# Patient Record
Sex: Male | Born: 1935 | Race: White | Hispanic: No | Smoking: Former smoker
Health system: Southern US, Community
[De-identification: ages and names within clinical notes are randomized; demographics above are authoritative.]

## PROBLEM LIST (undated history)

## (undated) DIAGNOSIS — G231 Progressive supranuclear ophthalmoplegia [Steele-Richardson-Olszewski]: Secondary | ICD-10-CM

## (undated) DIAGNOSIS — J449 Chronic obstructive pulmonary disease, unspecified: Secondary | ICD-10-CM

## (undated) DIAGNOSIS — K635 Polyp of colon: Secondary | ICD-10-CM

## (undated) DIAGNOSIS — G709 Myoneural disorder, unspecified: Secondary | ICD-10-CM

## (undated) DIAGNOSIS — M1990 Unspecified osteoarthritis, unspecified site: Secondary | ICD-10-CM

## (undated) DIAGNOSIS — R490 Dysphonia: Secondary | ICD-10-CM

## (undated) DIAGNOSIS — R0602 Shortness of breath: Secondary | ICD-10-CM

## (undated) HISTORY — PX: CATARACT EXTRACTION: SUR2

## (undated) HISTORY — PX: TONSILLECTOMY: SUR1361

## (undated) HISTORY — DX: Unspecified osteoarthritis, unspecified site: M19.90

## (undated) HISTORY — DX: Dysphonia: R49.0

## (undated) HISTORY — DX: Chronic obstructive pulmonary disease, unspecified: J44.9

## (undated) HISTORY — DX: Polyp of colon: K63.5

## (undated) HISTORY — DX: Progressive supranuclear ophthalmoplegia (steele-Richardson-olszewski): G23.1

---

## 2010-03-27 LAB — PULMONARY FUNCTION TEST

## 2011-01-20 ENCOUNTER — Ambulatory Visit (INDEPENDENT_AMBULATORY_CARE_PROVIDER_SITE_OTHER)
Admission: RE | Admit: 2011-01-20 | Discharge: 2011-01-20 | Disposition: A | Discharge status: Home / Self Care | Payer: Medicare Other | Source: Ambulatory Visit | Attending: Internal Medicine | Admitting: Internal Medicine

## 2011-01-20 ENCOUNTER — Ambulatory Visit (INDEPENDENT_AMBULATORY_CARE_PROVIDER_SITE_OTHER): Payer: Medicare Other | Admitting: Internal Medicine

## 2011-01-20 ENCOUNTER — Encounter: Payer: Self-pay | Admitting: *Deleted

## 2011-01-20 VITALS — BP 110/76 | HR 99 | Ht 73.0 in | Wt 179.0 lb

## 2011-01-20 DIAGNOSIS — J449 Chronic obstructive pulmonary disease, unspecified: Secondary | ICD-10-CM | POA: Insufficient documentation

## 2011-01-20 DIAGNOSIS — G231 Progressive supranuclear ophthalmoplegia [Steele-Richardson-Olszewski]: Secondary | ICD-10-CM | POA: Insufficient documentation

## 2011-01-20 DIAGNOSIS — J4489 Other specified chronic obstructive pulmonary disease: Secondary | ICD-10-CM

## 2011-01-20 DIAGNOSIS — G608 Other hereditary and idiopathic neuropathies: Secondary | ICD-10-CM

## 2011-01-20 DIAGNOSIS — Z0000 Encounter for general adult medical examination without abnormal findings: Secondary | ICD-10-CM

## 2011-01-20 HISTORY — DX: Progressive supranuclear ophthalmoplegia (steele-Richardson-olszewski): G23.1

## 2011-01-20 MED ORDER — TIOTROPIUM BROMIDE MONOHYDRATE 18 MCG IN CAPS: 18.0000 ug | ORAL_CAPSULE | Freq: Every day | RESPIRATORY_TRACT | Status: DC

## 2011-01-20 MED ORDER — SALMETEROL XINAFOATE 50 MCG/DOSE IN AEPB: 1.0000 | INHALATION_SPRAY | Freq: Two times a day (BID) | RESPIRATORY_TRACT | Status: DC

## 2011-01-20 MED ORDER — BECLOMETHASONE DIPROPIONATE 80 MCG/ACT IN AERS: 1.0000 | INHALATION_SPRAY | Freq: Two times a day (BID) | RESPIRATORY_TRACT | Status: DC

## 2011-01-20 NOTE — Progress Notes (Signed)
Subjective:    Patient ID: Spencer Montoya, male    DOB: [birth year], 75 y.o.   MRN: [id number]  HPI 01/20/11-  87 yoM former smoker, here with daughter,  seen for pulmonary evaluation at request of daughter who was former patient here.  50 Pack year smoking hx, QS x 5 years. Dx'd COPD stage 2, about 4-5 years ago while living in [place]. He moved to [place] 1 year ago, and moves here now to live with daughter. Widower, retired from [employer]..  Home O2 used for sleep x 2 years, but this was discontinued on move from [place], off x 1 week, needing to get re-established.  Describes little day to day change in breathing- probably worse in past year. . DOE around the house, but doesn't wake him. Does wheeze,  But controlled with Qvar 80, Serevent, Spiriva. Coughs only swallowing liquids. Swallowing eval reported negative- 09/05/10. Marland Kitchen  Has Supranuclear Palsy "form of Parkinson's", with no recent neurology evaluation. Aphonia  from this is almost total x 2 years- faintly whispers. Had Speech therapy [employer] [facility] [place] [facility] - uses walker. Denies heart disease Pneumonia x 1, 3-4 years ago. Had pneumovax. Gets flu vax. PFT 03/27/10- FEV1 2.15/ 68%; FEV1/FVC 0.77, No response to BD; TLC 115%; RV/ TLC 132%;  CXR- 03/05/10- NAD  Review of Systems Constitutional:   No-   weight loss, night sweats, fevers, chills, fatigue, lassitude. HEENT:   No-   headaches, difficulty swallowing, tooth/dental problems, sore throat,                  No-   sneezing, itching, ear ache, nasal congestion, post nasal drip,   CV:  No-   chest pain, orthopnea, PND, swelling in lower extremities, anasarca, dizziness, palpitations  GI:  No-   heartburn, indigestion, abdominal pain, nausea, vomiting, diarrhea,                 change in bowel habits, loss of appetite  Resp:  No-  excess mucus,               No-  coughing up of blood.              No-   change in color of mucus.   Skin: No-   rash or  lesions.  GU: No-   dysuria, change in color of urine, no urgency or frequency.  No- flank pain.  MS:  No-   joint pain or swelling.  No- decreased range of motion.  No- back pain.  Psych:  No- change in mood or affect. No depression or anxiety.  No memory loss.      Objective:   Physical Exam General- Alert, Oriented, Affect-appropriate, Distress- none acute  Medium build   walker Skin- rash-none, lesions- none, excoriation- none Lymphadenopathy- none Head- atraumatic            Eyes- Gross vision intact, PERRLA, conjunctivae clear secretions            Ears- Hearing, canals            Nose- Clear, Septal dev, mucus, polyps, erosion, perforation             Throat- Mallampati II , mucosa clear , drainage- none, tonsils- atrophic   Almost aphonic- faint whisper few words Neck- flexible , trachea midline, no stridor , thyroid nl, carotid no bruit Chest - symmetrical excursion , unlabored           Heart/CV- RRR ,  no murmur , no gallop  , no rub, nl s1 s2                           - JVD- none , edema- none, stasis changes- none, varices- none           Lung- clear to P&A, trace crackles on right, wheeze- none, cough- none , dullness-none, rub- none                   Room air sat on arrival 87%           Chest wall-  Abd- tender-no, distended-no, bowel sounds-present, HSM- no Br/ Gen/ Rectal- Not done, not indicated Extrem- cyanosis- none, clubbing, none, atrophy- none, strength- nl Neuro- grossly intact to observation         Assessment & Plan:

## 2011-01-20 NOTE — Assessment & Plan Note (Signed)
He will need to establish with Neurology. This is presumed basis for aphonia, with negative laryngscopy per daughter's report, about 1 year ago.

## 2011-01-20 NOTE — Assessment & Plan Note (Addendum)
Carries diagnosis of moderate COPD. His vocal cord dysfunction may contribute to airflow slowing, but there is no stridor. There is hx of choking while swallowing and a pneumonia 3-4 years ago, with normal speech therapy assisted swallowing eval report from March, 2012.Marland Kitchen He is at risk for aspiration at some point. Daughter reports laryngoscopy last year showed only vocal cord bowing consistent with age. The first problem is to get his O2 resumed. Was on O2 until left in [place] with move here. Arrival room air sat today only 87%.  meds are refilled. He will need to establish with PCP and will then need referral to a neurologist. He is a Cytogeneticist.

## 2011-01-20 NOTE — Patient Instructions (Signed)
Order-    [facility]- need referral to establish with primary care physician                 [facility]--  New home O2   2 L/m   Dx COPD      Resting room air sat 87% here today                 CXR- dx COPD  Meds refilled- sent to [facility]

## 2011-01-21 NOTE — Progress Notes (Signed)
Quick Note:  Pt aware of results. ______ 

## 2011-02-20 ENCOUNTER — Ambulatory Visit (INDEPENDENT_AMBULATORY_CARE_PROVIDER_SITE_OTHER): Payer: Medicare Other | Admitting: Internal Medicine

## 2011-02-20 ENCOUNTER — Encounter: Payer: Self-pay | Admitting: Internal Medicine

## 2011-02-20 VITALS — BP 108/60 | HR 97 | Ht 73.0 in | Wt 178.4 lb

## 2011-02-20 DIAGNOSIS — G608 Other hereditary and idiopathic neuropathies: Secondary | ICD-10-CM

## 2011-02-20 DIAGNOSIS — G231 Progressive supranuclear ophthalmoplegia [Steele-Richardson-Olszewski]: Secondary | ICD-10-CM

## 2011-02-20 DIAGNOSIS — J4489 Other specified chronic obstructive pulmonary disease: Secondary | ICD-10-CM

## 2011-02-20 DIAGNOSIS — J449 Chronic obstructive pulmonary disease, unspecified: Secondary | ICD-10-CM

## 2011-02-20 MED ORDER — CARBIDOPA-LEVODOPA ER 50-200 MG PO TBCR: 1.0000 | EXTENDED_RELEASE_TABLET | Freq: Three times a day (TID) | ORAL | Status: DC

## 2011-02-20 NOTE — Progress Notes (Signed)
Subjective:    Patient ID: Spencer Montoya, male    DOB: Aug 01, 1935, 75 y.o.   MRN: [id number]  HPI    Review of Systems     Objective:   Physical Exam        Assessment & Plan:   Subjective:    Patient ID: Spencer Montoya, male    DOB: 1935-09-06, 75 y.o.   MRN: [id number]  HPI 01/20/11-  29 yoM former smoker, here with daughter,  seen for pulmonary evaluation at request of daughter who was former patient here.  50 Pack year smoking hx, QS x 5 years. Dx'd COPD stage 2, about 4-5 years ago while living in [place]. He moved to [place] 1 year ago, and moves here now to live with daughter. Widower, retired from [employer]..  Home O2 used for sleep x 2 years, but this was discontinued on move from [place], off x 1 week, needing to get re-established.  Describes little day to day change in breathing- probably worse in past year. . DOE around the house, but doesn't wake him. Does wheeze,  But controlled with Qvar 80, Serevent, Spiriva. Coughs only swallowing liquids. Swallowing eval reported negative- 09/05/10. Marland Kitchen  Has Supranuclear Palsy "form of Parkinson's", with no recent neurology evaluation. Aphonia  from this is almost total x 2 years- faintly whispers. Had Speech therapy [employer] [facility] [place] [facility] - uses walker. Denies heart disease Pneumonia x 1, 3-4 years ago. Had pneumovax. Gets flu vax. PFT 03/27/10- FEV1 2.15/ 68%; FEV1/FVC 0.77, No response to BD; TLC 115%; RV/ TLC 132%;  CXR- 03/05/10- NAD  02/20/11- 35 year old former smoker followed for COPD, complicated by Supranuclear palsy/ vocal paresis After last visit, initial PCP visit is pending 02/28/11- he asks I refill carbidopa-levo ER 50-200, 1 TID, pending establishing a PCP. Then we expect PCP will refer to a Neurologist to establish.   . Home O2 started and helps, but he came today without it and resting room air sat 92% is better than 87% last time.  Now using mostly for sleep.  CXR 01/20/11- COPD, NAD.    Review of Systems Constitutional:   No-   weight loss, night sweats, fevers, chills, fatigue, lassitude. HEENT:   No-   headaches, difficulty swallowing, tooth/dental problems, sore throat,                  No-   sneezing, itching, ear ache, nasal congestion, post nasal drip,  CV:  No-   chest pain, orthopnea, PND, swelling in lower extremities, anasarca, dizziness, palpitations GI:  No-   heartburn, indigestion, abdominal pain, nausea, vomiting, diarrhea,                 change in bowel habits, loss of appetite Resp:  No-  excess mucus,               No-  coughing up of blood.              No-   change in color of mucus.   Skin: No-   rash or lesions. GU: No-   dysuria, change in color of urine, no urgency or frequency.  No- flank pain. MS:  No-   joint pain or swelling.  No- decreased range of motion.  No- back pain. Psych:  No- change in mood or affect. No depression or anxiety.  No memory loss.      Objective:   Physical Exam General- Alert, Oriented, Affect-appropriate, Distress- none acute  Medium  build   walker Skin- rash-none, lesions- none, excoriation- none Lymphadenopathy- none Head- atraumatic            Eyes- Gross vision intact, PERRLA, conjunctivae clear secretions            Ears- Hearing, canals-normal            Nose- Clear, no-Septal dev, mucus, polyps, erosion, perforation             Throat- Mallampati II , mucosa clear , drainage- none, tonsils- atrophic   +Almost aphonic- faint whisper few words barely audible.  Neck- flexible , trachea midline, no stridor , thyroid nl, carotid no bruit Chest - symmetrical excursion , unlabored           Heart/CV- RRR , no murmur , no gallop  , no rub, nl s1 s2                           - JVD- none , edema- none, stasis changes- none, varices- none           Lung- clear to P&A, trace crackles on right, wheeze- none, cough- none , dullness-none, rub- none                   Room air sat on arrival 87%           Chest wall-   Abd- tender-no, distended-no, bowel sounds-present, HSM- no Br/ Gen/ Rectal- Not done, not indicated Extrem- cyanosis- none, clubbing, none, atrophy- none, strength- nl Neuro- uses a walker. Voice quality is extremely weak         Assessment & Plan:

## 2011-02-20 NOTE — Assessment & Plan Note (Addendum)
Ok to refill Sinemet as a courtesy until his PCP can take over.  I'm not sure to what extent this neuropathy has caused his weak voice. Very weak respiratory muscle strength might contribute.

## 2011-02-20 NOTE — Patient Instructions (Addendum)
Refill script for Carbidopa levo-ER sent to [facility]  Your new primary physician will take over managing this medicine, or refer you to a Neurologist.    Try stopping Spiriva for a few days, then restart. See if being off it affects either your speech or your breathing.

## 2011-02-20 NOTE — Assessment & Plan Note (Addendum)
He is well controlled for now. I don't want to complicate his meds. We did discuss whether Spiriva affected his voice. Now while he is doing well, he will set Spiriva aside for a few days- see if that affects either breathing or voice.  Discussed flu shot for this fall.

## 2011-02-25 ENCOUNTER — Encounter: Payer: Self-pay | Admitting: Internal Medicine

## 2011-02-25 DIAGNOSIS — Z0000 Encounter for general adult medical examination without abnormal findings: Secondary | ICD-10-CM | POA: Insufficient documentation

## 2011-02-28 ENCOUNTER — Other Ambulatory Visit: Payer: Self-pay | Admitting: Internal Medicine

## 2011-02-28 ENCOUNTER — Other Ambulatory Visit (INDEPENDENT_AMBULATORY_CARE_PROVIDER_SITE_OTHER): Payer: Medicare Other

## 2011-02-28 ENCOUNTER — Ambulatory Visit (INDEPENDENT_AMBULATORY_CARE_PROVIDER_SITE_OTHER): Payer: Medicare Other | Admitting: Internal Medicine

## 2011-02-28 ENCOUNTER — Encounter: Payer: Self-pay | Admitting: Internal Medicine

## 2011-02-28 VITALS — BP 106/68 | HR 78 | Temp 97.5°F | Ht 73.0 in | Wt 176.4 lb

## 2011-02-28 DIAGNOSIS — R5383 Other fatigue: Secondary | ICD-10-CM

## 2011-02-28 DIAGNOSIS — N320 Bladder-neck obstruction: Secondary | ICD-10-CM

## 2011-02-28 DIAGNOSIS — R5381 Other malaise: Secondary | ICD-10-CM

## 2011-02-28 DIAGNOSIS — Z23 Encounter for immunization: Secondary | ICD-10-CM

## 2011-02-28 DIAGNOSIS — E785 Hyperlipidemia, unspecified: Secondary | ICD-10-CM

## 2011-02-28 DIAGNOSIS — Z0000 Encounter for general adult medical examination without abnormal findings: Secondary | ICD-10-CM

## 2011-02-28 DIAGNOSIS — G231 Progressive supranuclear ophthalmoplegia [Steele-Richardson-Olszewski]: Secondary | ICD-10-CM

## 2011-02-28 DIAGNOSIS — G608 Other hereditary and idiopathic neuropathies: Secondary | ICD-10-CM

## 2011-02-28 LAB — CBC WITH DIFFERENTIAL/PLATELET
Basophils Absolute: 0 10*3/uL (ref 0.0–0.1)
Eosinophils Relative: 1.1 % (ref 0.0–5.0)
HCT: 44.9 % (ref 39.0–52.0)
Hemoglobin: 15.3 g/dL (ref 13.0–17.0)
Lymphocytes Relative: 18.4 % (ref 12.0–46.0)
Lymphs Abs: 2 10*3/uL (ref 0.7–4.0)
Monocytes Relative: 10 % (ref 3.0–12.0)
Platelets: 254 10*3/uL (ref 150.0–400.0)
WBC: 10.9 10*3/uL — ABNORMAL HIGH (ref 4.5–10.5)

## 2011-02-28 LAB — URINALYSIS, ROUTINE W REFLEX MICROSCOPIC
Bilirubin Urine: NEGATIVE
Leukocytes, UA: NEGATIVE
Nitrite: NEGATIVE
Specific Gravity, Urine: 1.025 (ref 1.000–1.030)
Total Protein, Urine: NEGATIVE
pH: 5.5 (ref 5.0–8.0)

## 2011-02-28 LAB — LIPID PANEL
Cholesterol: 221 mg/dL — ABNORMAL HIGH (ref 0–200)
HDL: 52.6 mg/dL (ref >39.00)
Total CHOL/HDL Ratio: 4
Triglycerides: 138 mg/dL (ref 0.0–149.0)
VLDL: 27.6 mg/dL (ref 0.0–40.0)

## 2011-02-28 LAB — BASIC METABOLIC PANEL
BUN: 21 mg/dL (ref 6–23)
CO2: 27 mEq/L (ref 19–32)
Calcium: 9.4 mg/dL (ref 8.4–10.5)
GFR: 67.65 mL/min (ref >60.00)
Glucose, Bld: 92 mg/dL (ref 70–99)

## 2011-02-28 LAB — HEPATIC FUNCTION PANEL
AST: 20 U/L (ref 0–37)
Albumin: 4.2 g/dL (ref 3.5–5.2)
Total Protein: 7 g/dL (ref 6.0–8.3)

## 2011-02-28 LAB — PSA: PSA: 2.52 ng/mL (ref 0.10–4.00)

## 2011-02-28 MED ORDER — CARBIDOPA-LEVODOPA ER 50-200 MG PO TBCR: 1.0000 | EXTENDED_RELEASE_TABLET | Freq: Three times a day (TID) | ORAL | Status: DC

## 2011-02-28 NOTE — Assessment & Plan Note (Signed)
stable overall by hx and exam,  and pt to continue medical treatment as beforem, to check lipids

## 2011-02-28 NOTE — Patient Instructions (Addendum)
You had the flu shot today You will be contacted regarding the referral for: Physical therapy, and the colonoscopy  Please go to LAB in the Basement for the blood and/or urine tests to be done today Please call the phone number [phone] (the PhoneTree System) for results of testing in 2-3 days;  When calling, simply dial the number, and when prompted enter the MRN number above (the Medical Record Number) and the # key, then the message should start. Your refills were sent to the pharmacy Please return in 6 months, or sooner if needed

## 2011-02-28 NOTE — Assessment & Plan Note (Signed)
Not charged today, but due for tetanus (declines), flu shot (will take), and colonoscopy as he was last eval in 2006 in [place], not sure of results but remembers he was due for f/u in 5 yrs

## 2011-02-28 NOTE — Assessment & Plan Note (Signed)
Etiology unclear, Exam otherwise benign, to check labs as documented, follow with expectant management  

## 2011-02-28 NOTE — Assessment & Plan Note (Signed)
With some ? Increased freq of falls with several skin tears and bruises to arms;  For PT eval and tx, refill med

## 2011-02-28 NOTE — Assessment & Plan Note (Signed)
Also to check PSA as he is due

## 2011-02-28 NOTE — Progress Notes (Signed)
Subjective:    Patient ID: Spencer Montoya, male    DOB: [birth year], 75 y.o.   MRN: [id number]  HPI  Here to establish as new pt;  Pt denies chest pain, increased sob or doe, wheezing, orthopnea, PND, increased LE swelling, palpitations, dizziness or syncope.  Pt denies new neurological symptoms such as new headache, or facial or extremity weakness or numbness, though has chronic supranuclear palsy with hoarseness, walks with walker, and several recent skin tears and falls - ? Worse than usual.   Pt denies polydipsia, polyuria,   Pt states overall good compliance with meds, not really trying to follow lower cholesterol diet, wt overall stable.  Denies worsening reflux, dysphagia, abd pain, n/v, bowel change or blood. Does have sense of ongoing fatigue, but denies signficant hypersomnolence.  Past Medical History  Diagnosis Date  . COPD (chronic obstructive pulmonary disease)     STAGE 2  . Arthritis   . Dysphonia   . Supranuclear palsy 01/20/2011   Past Surgical History  Procedure Date  . Tonsillectomy     reports that he quit smoking about 4 years ago. He has never used smokeless tobacco. He reports that he drinks alcohol. His drug history not on file. family history includes Cancer in an unspecified family member; Diabetes in an unspecified family member; Hypertension in an unspecified family member; and Stroke in his other. No Known Allergies Current Outpatient Prescriptions on File Prior to Visit  Medication Sig Dispense Refill  . aspirin 81 MG tablet Take 81 mg by mouth daily.        . beclomethasone (QVAR) 80 MCG/ACT inhaler Inhale 1 puff into the lungs 2 (two) times daily. Rinse mouth  1 Inhaler  prn  . naproxen sodium (ANAPROX) 220 MG tablet Take 220 mg by mouth 2 (two) times daily with a meal.        . omeprazole (PRILOSEC) 20 MG capsule Take 20 mg by mouth daily.        . salmeterol (SEREVENT) 50 MCG/DOSE diskus inhaler Inhale 1 puff into the lungs 2 (two) times daily.  1 Inhaler   prn  . tiotropium (SPIRIVA) 18 MCG inhalation capsule Place 1 capsule (18 mcg total) into inhaler and inhale daily.  30 capsule  prn   Review of Systems Review of Systems  Constitutional: Negative for diaphoresis and unexpected weight change.  HENT: Negative for drooling and tinnitus.   Eyes: Negative for photophobia and visual disturbance.  Respiratory: Negative for choking and stridor.   Gastrointestinal: Negative for vomiting and blood in stool.  Genitourinary: Negative for hematuria and decreased urine volume.  Psychiatric/Behavioral: Negative for decreased concentration. The patient is not hyperactive.       Objective:   Physical Exam BP 106/68  Pulse 78  Temp(Src) 97.5 F (36.4 C) (Oral)  Ht 6\' 1"  (1.854 m)  Wt 176 lb 6 oz (80.003 kg)  BMI 23.27 kg/m2  SpO2 90% Physical Exam  VS noted Constitutional: Pt appears well-developed and well-nourished.  HENT: Head: Normocephalic.  Right Ear: External ear normal.  Left Ear: External ear normal.  Eyes: Conjunctivae and EOM are normal. Pupils are equal, round, and reactive to light.  Neck: Normal range of motion. Neck supple.  Cardiovascular: Normal rate and regular rhythm.   Pulmonary/Chest: Effort normal and breath sounds normal.  Abd:  Soft, NT, non-distended, + BS Neurological: Pt is alert. No cranial nerve deficit.  Skin: Skin is warm. No erythema.  Psychiatric: Pt behavior is normal. Thought content normal. not  depressed appaering       Assessment & Plan:

## 2011-04-07 ENCOUNTER — Encounter: Payer: Self-pay | Admitting: Gastroenterology

## 2011-05-27 ENCOUNTER — Encounter (HOSPITAL_BASED_OUTPATIENT_CLINIC_OR_DEPARTMENT_OTHER): Payer: Self-pay | Admitting: *Deleted

## 2011-05-27 ENCOUNTER — Emergency Department (INDEPENDENT_AMBULATORY_CARE_PROVIDER_SITE_OTHER): Payer: Medicare Other

## 2011-05-27 ENCOUNTER — Emergency Department (HOSPITAL_BASED_OUTPATIENT_CLINIC_OR_DEPARTMENT_OTHER)
Admission: EM | Admit: 2011-05-27 | Discharge: 2011-05-27 | Disposition: A | Discharge status: Home / Self Care | Payer: Medicare Other | Attending: Emergency Medicine | Admitting: Emergency Medicine

## 2011-05-27 DIAGNOSIS — J449 Chronic obstructive pulmonary disease, unspecified: Secondary | ICD-10-CM | POA: Insufficient documentation

## 2011-05-27 DIAGNOSIS — S2249XA Multiple fractures of ribs, unspecified side, initial encounter for closed fracture: Secondary | ICD-10-CM | POA: Insufficient documentation

## 2011-05-27 DIAGNOSIS — S2239XA Fracture of one rib, unspecified side, initial encounter for closed fracture: Secondary | ICD-10-CM

## 2011-05-27 DIAGNOSIS — W010XXA Fall on same level from slipping, tripping and stumbling without subsequent striking against object, initial encounter: Secondary | ICD-10-CM | POA: Insufficient documentation

## 2011-05-27 DIAGNOSIS — J4489 Other specified chronic obstructive pulmonary disease: Secondary | ICD-10-CM | POA: Insufficient documentation

## 2011-05-27 DIAGNOSIS — W1809XA Striking against other object with subsequent fall, initial encounter: Secondary | ICD-10-CM

## 2011-05-27 DIAGNOSIS — Y92009 Unspecified place in unspecified non-institutional (private) residence as the place of occurrence of the external cause: Secondary | ICD-10-CM | POA: Insufficient documentation

## 2011-05-27 DIAGNOSIS — Z8739 Personal history of other diseases of the musculoskeletal system and connective tissue: Secondary | ICD-10-CM | POA: Insufficient documentation

## 2011-05-27 DIAGNOSIS — Z79899 Other long term (current) drug therapy: Secondary | ICD-10-CM | POA: Insufficient documentation

## 2011-05-27 MED ORDER — OXYCODONE-ACETAMINOPHEN 5-325 MG PO TABS: 1.0000 | ORAL_TABLET | ORAL | Status: AC | PRN

## 2011-05-27 MED ORDER — OXYCODONE-ACETAMINOPHEN 5-325 MG PO TABS
2.0000 | ORAL_TABLET | Freq: Once | ORAL | Status: AC
  Administered 2011-05-27: 2 via ORAL
  Filled 2011-05-27: qty 2

## 2011-05-27 MED ORDER — NAPROXEN 500 MG PO TABS: 500.0000 mg | ORAL_TABLET | Freq: Two times a day (BID) | ORAL | Status: DC

## 2011-05-27 NOTE — ED Provider Notes (Signed)
History     CSN: [id number] Arrival date & time: 05/27/2011  6:19 AM   None     Chief Complaint  Patient presents with  . Fall    (Consider location/radiation/quality/duration/timing/severity/associated sxs/prior treatment) HPI Comments: According to family member patient has history of falls and possible early Parkinson's and presents with pain in his posterior ribs after fall last night at dinner. According to family members he was backing away from the countertop  holding his plate and fell backwards into the kitchen table after losing his balance.  According to family members who witnessed this fall there was no head injury and has been ambulatory but complaining of pain in his right mid back throughout the night and this morning  Patient is a 75 y.o. male presenting with fall. The history is provided by the patient and a relative.  Fall The accident occurred 6 to 12 hours ago. Incident: When he fell backwards striking his right posterior ribs on the kitchen table and falling to his buttock. Distance fallen: Standing. He landed on a hard floor Personnel officer table). There was no blood loss. Point of impact: Right posterior ribs. Pain location: Right posterior ribs. The pain is moderate. He was ambulatory at the scene. There was no entrapment after the fall. There was no alcohol use involved in the accident. Pertinent negatives include no numbness, no nausea, no headaches and no loss of consciousness. Exacerbated by: Palpation of ribs, deep breathing. He has tried nothing for the symptoms.    Past Medical History  Diagnosis Date  . COPD (chronic obstructive pulmonary disease)     STAGE 2  . Arthritis   . Dysphonia   . Supranuclear palsy 01/20/2011    Past Surgical History  Procedure Date  . Tonsillectomy     Family History  Problem Relation Age of Onset  . Diabetes    . Hypertension    . Cancer    . Stroke Other     History  Substance Use Topics  . Smoking status: Former  Smoker -- 1.0 packs/day for 50 years    Quit date: 06/30/2006  . Smokeless tobacco: Never Used  . Alcohol Use: Yes     rare      Review of Systems  HENT: Negative for neck pain.   Respiratory: Negative for shortness of breath.   Gastrointestinal: Negative for nausea.  Musculoskeletal: Positive for back pain.  Neurological: Negative for loss of consciousness, numbness and headaches.    Allergies  Review of patient's allergies indicates no known allergies.  Home Medications   Current Outpatient Rx  Name Route Sig Dispense Refill  . ASPIRIN 81 MG PO TABS Oral Take 81 mg by mouth daily.      . BECLOMETHASONE DIPROPIONATE 80 MCG/ACT IN AERS Inhalation Inhale 1 puff into the lungs 2 (two) times daily. Rinse mouth 1 Inhaler prn  . CARBIDOPA-LEVODOPA ER 50-200 MG PO TBCR Oral Take 1 tablet by mouth 3 (three) times daily. 90 tablet 5  . NAPROXEN 500 MG PO TABS Oral Take 1 tablet (500 mg total) by mouth 2 (two) times daily with a meal. 30 tablet 0  . NAPROXEN SODIUM 220 MG PO TABS Oral Take 220 mg by mouth 2 (two) times daily with a meal.      . OMEPRAZOLE 20 MG PO CPDR Oral Take 20 mg by mouth daily.      . OXYCODONE-ACETAMINOPHEN 5-325 MG PO TABS Oral Take 1 tablet by mouth every 4 (four) hours as needed  for pain. May take 2 tablets PO q 6 hours for severe pain - Do not take with Tylenol as this tablet already contains tylenol 15 tablet 0  . SALMETEROL XINAFOATE 50 MCG/DOSE IN AEPB Inhalation Inhale 1 puff into the lungs 2 (two) times daily. 1 Inhaler prn  . TIOTROPIUM BROMIDE MONOHYDRATE 18 MCG IN CAPS Inhalation Place 1 capsule (18 mcg total) into inhaler and inhale daily. 30 capsule prn    BP 112/60  Pulse 67  Resp 18  Ht 6\' 1"  (1.854 m)  Wt 180 lb (81.647 kg)  BMI 23.75 kg/m2  SpO2 94%  Physical Exam  Constitutional: He appears well-developed and well-nourished.       Uncomfortable appearing  HENT:  Head: Normocephalic and atraumatic.  Mouth/Throat: Oropharynx is clear and  moist.  Neck: Normal range of motion. Neck supple. No JVD present. No tracheal deviation present. No thyromegaly present.  Cardiovascular: Normal rate, regular rhythm, normal heart sounds and intact distal pulses.   Pulmonary/Chest: Effort normal and breath sounds normal. No respiratory distress. He has no wheezes. He has no rales. He exhibits tenderness ( Tender to palpation over the right posterior ribs, no crepitance, subcutaneous emphysema felt or palpated).  Abdominal: Soft. Bowel sounds are normal. He exhibits no distension. There is no tenderness. There is no rebound and no guarding.  Musculoskeletal: Normal range of motion. He exhibits no edema and no tenderness.       No tenderness over the entire spine  Lymphadenopathy:    He has no cervical adenopathy.  Neurological: He is alert.    ED Course  Procedures (including critical care time)  Labs Reviewed - No data to display Dg Ribs Unilateral W/chest Right  05/27/2011  *RADIOLOGY REPORT*  Clinical Data: Status post fall; hit right-sided ribs on coffee table.  Severe shortness of breath and chest pain.  RIGHT RIBS AND CHEST - 3+ VIEW  Comparison: Chest radiograph performed 01/20/2011  Findings: There are mildly displaced fractures involving the right posterolateral ninth, tenth and eleventh ribs.  The lungs are well-aerated.  Minimal right-sided atelectasis is noted.  There is no evidence of pleural effusion or pneumothorax.  The cardiomediastinal silhouette is normal in size; calcification is noted within the aortic arch.  No acute osseous abnormalities are seen.  IMPRESSION:  1.  Mildly displaced fractures involving the right posterolateral ninth, tenth and eleventh ribs. 2.  Minimal right-sided atelectasis noted.  Original Report Authenticated By: Tonia Ghent, M.D.     1. Rib fractures       MDM  Patient is able to follow commands, has no spinal tenderness to palpation from the cervical spine to the sacrum however he does have  some rib tenderness after this fall. According to the family he has had a broken rib in the past after a fall. Oxygen saturation is slightly low at 94% on room air however the patient is in no distress. There is no tachycardia or hypotension. Will need x-rays to rule out fracture and pneumothorax. Pain medication has been ordered   Pt has tolerated pain medicines well, has good sat's for someone on home O2 - no pulmonary contusions seen and mechanism seemingly minor - will d/c home and have f/u as needed.    Definitive Fracture Care:  Definitive fracture care performred for the rib fractures.  This included analgesia in the ED, incentive spiromoterly, which have been provided.  I have counseled the pt on possible complications of the fractures and signs and symptoms which would  mandate return for further care.      Vida Roller, MD 05/27/11 [phone]

## 2011-05-27 NOTE — ED Notes (Signed)
Pt presents to ED today after fall from standing hitting table then landed on buttocks.  Pt c/o 10/10 back pain

## 2011-05-27 NOTE — ED Notes (Signed)
Pt presents to ED today 10/10 back pain after fall yesterday.  Pt pain mainly located in mid back and more on the right side.  No obvious deformity noted

## 2011-06-04 ENCOUNTER — Encounter (HOSPITAL_BASED_OUTPATIENT_CLINIC_OR_DEPARTMENT_OTHER): Payer: Self-pay | Admitting: Emergency Medicine

## 2011-06-04 ENCOUNTER — Emergency Department (HOSPITAL_BASED_OUTPATIENT_CLINIC_OR_DEPARTMENT_OTHER)
Admission: EM | Admit: 2011-06-04 | Discharge: 2011-06-04 | Disposition: A | Discharge status: Home / Self Care | Payer: Medicare Other | Attending: Emergency Medicine | Admitting: Emergency Medicine

## 2011-06-04 ENCOUNTER — Emergency Department (INDEPENDENT_AMBULATORY_CARE_PROVIDER_SITE_OTHER): Payer: Medicare Other

## 2011-06-04 DIAGNOSIS — R05 Cough: Secondary | ICD-10-CM | POA: Insufficient documentation

## 2011-06-04 DIAGNOSIS — Z8739 Personal history of other diseases of the musculoskeletal system and connective tissue: Secondary | ICD-10-CM | POA: Insufficient documentation

## 2011-06-04 DIAGNOSIS — Z79899 Other long term (current) drug therapy: Secondary | ICD-10-CM | POA: Insufficient documentation

## 2011-06-04 DIAGNOSIS — R0602 Shortness of breath: Secondary | ICD-10-CM

## 2011-06-04 DIAGNOSIS — J438 Other emphysema: Secondary | ICD-10-CM

## 2011-06-04 DIAGNOSIS — R079 Chest pain, unspecified: Secondary | ICD-10-CM | POA: Insufficient documentation

## 2011-06-04 DIAGNOSIS — R059 Cough, unspecified: Secondary | ICD-10-CM | POA: Insufficient documentation

## 2011-06-04 DIAGNOSIS — R0781 Pleurodynia: Secondary | ICD-10-CM

## 2011-06-04 DIAGNOSIS — J4489 Other specified chronic obstructive pulmonary disease: Secondary | ICD-10-CM | POA: Insufficient documentation

## 2011-06-04 DIAGNOSIS — J449 Chronic obstructive pulmonary disease, unspecified: Secondary | ICD-10-CM | POA: Insufficient documentation

## 2011-06-04 MED ORDER — IPRATROPIUM BROMIDE 0.02 % IN SOLN
0.5000 mg | Freq: Once | RESPIRATORY_TRACT | Status: AC
  Administered 2011-06-04: 0.5 mg via RESPIRATORY_TRACT
  Filled 2011-06-04: qty 2.5

## 2011-06-04 MED ORDER — PREDNISONE 50 MG PO TABS
50.0000 mg | ORAL_TABLET | Freq: Once | ORAL | Status: AC
  Administered 2011-06-04: 50 mg via ORAL
  Filled 2011-06-04: qty 1

## 2011-06-04 MED ORDER — ALBUTEROL SULFATE (5 MG/ML) 0.5% IN NEBU
5.0000 mg | INHALATION_SOLUTION | Freq: Once | RESPIRATORY_TRACT | Status: AC
  Administered 2011-06-04: 5 mg via RESPIRATORY_TRACT
  Filled 2011-06-04: qty 1

## 2011-06-04 NOTE — ED Notes (Signed)
Pt c/o increased shob with wheezing. Pt has rib fx from fall x 1 week ago.

## 2011-06-04 NOTE — Patient Instructions (Signed)
Instructed pt on the proper use of Acapella(flutter) valve. Device was used in conjunction with albuteral/atrovent HHN. Pt demonstrated proper use. Pt tolerated well.

## 2011-06-04 NOTE — ED Notes (Signed)
Daughter states pt does have inhales and IS at home-RT gave pt instructions of flutter valve use

## 2011-06-04 NOTE — ED Provider Notes (Signed)
History     CSN: [id number] Arrival date & time: 06/04/2011  8:12 PM   First MD Initiated Contact with Patient 06/04/11 2041      Chief Complaint  Patient presents with  . Shortness of Breath   patient with a known history of stage II, COPD, arthritis. Seen 1 week ago for a fall and right rib fractures. His daughter states he is on multiple inhalers at home, but had some increasing cough and wheezing at home. This evening. She states she was concerned for pneumonia, so she brought him in. He has had no chest pain. No fever, no leg swelling. His pain is controlled with his indications. No vomiting, weakness, or dizziness. No other complaints at this time.  (Consider location/radiation/quality/duration/timing/severity/associated sxs/prior treatment) HPI  Past Medical History  Diagnosis Date  . COPD (chronic obstructive pulmonary disease)     STAGE 2  . Arthritis   . Dysphonia   . Supranuclear palsy 01/20/2011    Past Surgical History  Procedure Date  . Tonsillectomy     Family History  Problem Relation Age of Onset  . Diabetes    . Hypertension    . Cancer    . Stroke Other     History  Substance Use Topics  . Smoking status: Former Smoker -- 1.0 packs/day for 50 years    Quit date: 06/30/2006  . Smokeless tobacco: Never Used  . Alcohol Use: Yes     rare      Review of Systems  Constitutional: Negative for activity change.  Respiratory: Positive for cough. Negative for apnea.   Cardiovascular: Negative for chest pain.  Neurological: Negative for dizziness and numbness.  All other systems reviewed and are negative.    Allergies  Review of patient's allergies indicates no known allergies.  Home Medications   Current Outpatient Rx  Name Route Sig Dispense Refill  . ASPIRIN 81 MG PO TABS Oral Take 81 mg by mouth daily.      . BECLOMETHASONE DIPROPIONATE 80 MCG/ACT IN AERS Inhalation Inhale 1 puff into the lungs 2 (two) times daily. Rinse mouth 1 Inhaler prn    . CARBIDOPA-LEVODOPA ER 50-200 MG PO TBCR Oral Take 1 tablet by mouth 3 (three) times daily. 90 tablet 5  . NAPROXEN 500 MG PO TABS Oral Take 1 tablet (500 mg total) by mouth 2 (two) times daily with a meal. 30 tablet 0  . NAPROXEN SODIUM 220 MG PO TABS Oral Take 220 mg by mouth 2 (two) times daily with a meal.      . OMEPRAZOLE 20 MG PO CPDR Oral Take 20 mg by mouth daily.      . OXYCODONE-ACETAMINOPHEN 5-325 MG PO TABS Oral Take 1 tablet by mouth every 4 (four) hours as needed for pain. May take 2 tablets PO q 6 hours for severe pain - Do not take with Tylenol as this tablet already contains tylenol 15 tablet 0  . SALMETEROL XINAFOATE 50 MCG/DOSE IN AEPB Inhalation Inhale 1 puff into the lungs 2 (two) times daily. 1 Inhaler prn  . TIOTROPIUM BROMIDE MONOHYDRATE 18 MCG IN CAPS Inhalation Place 1 capsule (18 mcg total) into inhaler and inhale daily. 30 capsule prn    BP 114/64  Pulse 80  Temp(Src) 97.7 F (36.5 C) (Oral)  Resp 22  SpO2 92%  Physical Exam  Nursing note and vitals reviewed. Constitutional: He is oriented to person, place, and time. He appears well-developed and well-nourished.  HENT:  Head: Normocephalic and atraumatic.  Eyes: Conjunctivae and EOM are normal. Pupils are equal, round, and reactive to light.  Neck: Neck supple.  Cardiovascular: Normal rate and regular rhythm.  Exam reveals no gallop and no friction rub.   No murmur heard. Pulmonary/Chest: Breath sounds normal. He has no wheezes. He has no rales. He exhibits no tenderness.       Mild diffuse wheezing, no rhonchi. Healing bruises in the right posterior chest wall. No crepitus. No significant tenderness to palpation speaking comfortably in complete sentences. No respiratory distress  Abdominal: Soft. Bowel sounds are normal. He exhibits no distension. There is no tenderness. There is no rebound and no guarding.  Musculoskeletal: Normal range of motion.  Neurological: He is alert and oriented to person, place,  and time. No cranial nerve deficit. Coordination normal.  Skin: Skin is warm and dry. No rash noted.  Psychiatric: He has a normal mood and affect.    ED Course  Procedures (including critical care time)  Labs Reviewed - No data to display Dg Chest 2 View  06/04/2011  *RADIOLOGY REPORT*  Clinical Data: Increase shortness of breath.  Recent rib fracture.  CHEST - 2 VIEW  Comparison: Chest and rib radiographs 05/27/2011.  Findings: The heart size is normal.  Emphysematous changes again noted.  No focal airspace disease is evident.  The right side rib fractures are again noted.  There is no pneumothorax.  IMPRESSION:  1.  Stable appearance of mildly displaced right posterolateral rib fractures. 2.  No pneumothorax. 3.  Emphysema. 4.  No other acute cardiopulmonary disease.  Original Report Authenticated By: Jamesetta Orleans. MATTERN, M.D.     No diagnosis found.    MDM  Pt is seen and examined;  Initial history and physical completed.  Will follow.        9:55 PM The patient feels much better after breathing treatments, steroids, and a flutter valve. He will be stable for discharge home. They know to continue the incentive spirometer and breathing treatments at home as needed. Also recommending close followup with primary doctor or returning to ED for any concerns or changing symptoms.  Kazden Largo A. Patrica Duel, MD 06/04/11 2155

## 2011-06-06 ENCOUNTER — Emergency Department (INDEPENDENT_AMBULATORY_CARE_PROVIDER_SITE_OTHER): Payer: Medicare Other

## 2011-06-06 ENCOUNTER — Emergency Department (HOSPITAL_BASED_OUTPATIENT_CLINIC_OR_DEPARTMENT_OTHER)
Admission: EM | Admit: 2011-06-06 | Discharge: 2011-06-06 | Disposition: A | Discharge status: Home / Self Care | Payer: Medicare Other | Attending: Emergency Medicine | Admitting: Emergency Medicine

## 2011-06-06 ENCOUNTER — Encounter (HOSPITAL_BASED_OUTPATIENT_CLINIC_OR_DEPARTMENT_OTHER): Payer: Self-pay

## 2011-06-06 DIAGNOSIS — W19XXXA Unspecified fall, initial encounter: Secondary | ICD-10-CM | POA: Insufficient documentation

## 2011-06-06 DIAGNOSIS — Z8739 Personal history of other diseases of the musculoskeletal system and connective tissue: Secondary | ICD-10-CM | POA: Insufficient documentation

## 2011-06-06 DIAGNOSIS — S0180XA Unspecified open wound of other part of head, initial encounter: Secondary | ICD-10-CM

## 2011-06-06 DIAGNOSIS — J449 Chronic obstructive pulmonary disease, unspecified: Secondary | ICD-10-CM

## 2011-06-06 DIAGNOSIS — R51 Headache: Secondary | ICD-10-CM

## 2011-06-06 DIAGNOSIS — J4489 Other specified chronic obstructive pulmonary disease: Secondary | ICD-10-CM | POA: Insufficient documentation

## 2011-06-06 DIAGNOSIS — G20A1 Parkinson's disease without dyskinesia, without mention of fluctuations: Secondary | ICD-10-CM | POA: Insufficient documentation

## 2011-06-06 DIAGNOSIS — S2249XA Multiple fractures of ribs, unspecified side, initial encounter for closed fracture: Secondary | ICD-10-CM

## 2011-06-06 DIAGNOSIS — R0602 Shortness of breath: Secondary | ICD-10-CM

## 2011-06-06 DIAGNOSIS — G20 Parkinson's disease: Secondary | ICD-10-CM | POA: Insufficient documentation

## 2011-06-06 DIAGNOSIS — G319 Degenerative disease of nervous system, unspecified: Secondary | ICD-10-CM

## 2011-06-06 DIAGNOSIS — IMO0002 Reserved for concepts with insufficient information to code with codable children: Secondary | ICD-10-CM

## 2011-06-06 DIAGNOSIS — Y9301 Activity, walking, marching and hiking: Secondary | ICD-10-CM | POA: Insufficient documentation

## 2011-06-06 MED ORDER — LIDOCAINE-EPINEPHRINE 2 %-1:100000 IJ SOLN
INTRAMUSCULAR | Status: AC
  Administered 2011-06-06: 1 mL
  Filled 2011-06-06: qty 1

## 2011-06-06 MED ORDER — TETANUS-DIPHTHERIA TOXOIDS TD 5-2 LFU IM INJ
0.5000 mL | INJECTION | Freq: Once | INTRAMUSCULAR | Status: AC
  Administered 2011-06-06: 0.5 mL via INTRAMUSCULAR
  Filled 2011-06-06: qty 0.5

## 2011-06-06 NOTE — ED Provider Notes (Signed)
History     CSN: [id number] Arrival date & time: 06/06/2011  6:58 PM   First MD Initiated Contact with Patient 06/06/11 1934      Chief Complaint  Patient presents with  . Fall  . Facial Laceration    (Consider location/radiation/quality/duration/timing/severity/associated sxs/prior treatment) Patient is a 75 y.o. male presenting with fall. The history is provided by the patient.  Fall   patient here after suffering a fall while walking. Sustained a laceration to his right eyebrow. No loss of consciousness. Denies any neck pain or upper extremity paresthesias. Denies any chest or abdominal pain. No complaints of pain below the waist. Denies any trouble breathing. Does use a walker normally, has a history of Parkinson's disease, and has been having gait problems for some time  Past Medical History  Diagnosis Date  . COPD (chronic obstructive pulmonary disease)     STAGE 2  . Arthritis   . Dysphonia   . Supranuclear palsy 01/20/2011    Past Surgical History  Procedure Date  . Tonsillectomy     Family History  Problem Relation Age of Onset  . Diabetes    . Hypertension    . Cancer    . Stroke Other     History  Substance Use Topics  . Smoking status: Former Smoker -- 1.0 packs/day for 50 years    Quit date: 06/30/2006  . Smokeless tobacco: Never Used  . Alcohol Use: Yes     rare      Review of Systems  All other systems reviewed and are negative.    Allergies  Review of patient's allergies indicates no known allergies.  Home Medications   Current Outpatient Rx  Name Route Sig Dispense Refill  . ASPIRIN 81 MG PO TABS Oral Take 81 mg by mouth daily.      . BECLOMETHASONE DIPROPIONATE 80 MCG/ACT IN AERS Inhalation Inhale 1 puff into the lungs 2 (two) times daily. Rinse mouth 1 Inhaler prn  . CARBIDOPA-LEVODOPA ER 50-200 MG PO TBCR Oral Take 1 tablet by mouth 3 (three) times daily. 90 tablet 5  . NAPROXEN 500 MG PO TABS Oral Take 1 tablet (500 mg total) by  mouth 2 (two) times daily with a meal. 30 tablet 0  . NAPROXEN SODIUM 220 MG PO TABS Oral Take 220 mg by mouth 2 (two) times daily with a meal.      . OMEPRAZOLE 20 MG PO CPDR Oral Take 20 mg by mouth daily.      . OXYCODONE-ACETAMINOPHEN 5-325 MG PO TABS Oral Take 1 tablet by mouth every 4 (four) hours as needed for pain. May take 2 tablets PO q 6 hours for severe pain - Do not take with Tylenol as this tablet already contains tylenol 15 tablet 0  . SALMETEROL XINAFOATE 50 MCG/DOSE IN AEPB Inhalation Inhale 1 puff into the lungs 2 (two) times daily. 1 Inhaler prn  . TIOTROPIUM BROMIDE MONOHYDRATE 18 MCG IN CAPS Inhalation Place 1 capsule (18 mcg total) into inhaler and inhale daily. 30 capsule prn    BP 139/69  Pulse 74  Temp(Src) 97.7 F (36.5 C) (Oral)  Resp 20  Ht 6\' 1"  (1.854 m)  Wt 180 lb (81.647 kg)  BMI 23.75 kg/m2  SpO2 95%  Physical Exam  Nursing note and vitals reviewed. Constitutional: He is oriented to person, place, and time. He appears well-developed and well-nourished.  Non-toxic appearance. No distress.  HENT:  Head: Normocephalic. Head is with laceration.    Eyes:  Conjunctivae, EOM and lids are normal. Pupils are equal, round, and reactive to light.  Neck: Normal range of motion. Neck supple. No spinous process tenderness and no muscular tenderness present. No tracheal deviation present. No mass present.  Cardiovascular: Normal rate, regular rhythm and normal heart sounds.  Exam reveals no gallop.   No murmur heard. Pulmonary/Chest: Effort normal. No stridor. No respiratory distress. He has no decreased breath sounds. He has wheezes. He has no rhonchi. He has no rales.  Abdominal: Soft. Normal appearance and bowel sounds are normal. He exhibits no distension. There is no tenderness. There is no rebound and no CVA tenderness.  Musculoskeletal: Normal range of motion. He exhibits no edema and no tenderness.  Neurological: He is alert and oriented to person, place, and  time. He has normal strength. No cranial nerve deficit or sensory deficit. GCS eye subscore is 4. GCS verbal subscore is 5. GCS motor subscore is 6.  Skin: Skin is warm and dry. Abrasion and bruising noted. No rash noted.  Psychiatric: He has a normal mood and affect. His speech is normal and behavior is normal.    ED Course  LACERATION REPAIR Date/Time: 06/06/2011 8:58 PM Performed by: Teressa Lower Authorized by: Teressa Lower Consent: Verbal consent obtained. Risks and benefits: risks, benefits and alternatives were discussed Consent given by: patient Body area: head/neck Location details: right eyelid Laceration length: 3 cm Foreign bodies: no foreign bodies Anesthesia: local infiltration Local anesthetic: lidocaine 2% with epinephrine Irrigation solution: saline Irrigation method: syringe Amount of cleaning: standard Skin closure: 5-0 Prolene Technique: simple Approximation: close Approximation difficulty: simple   (including critical care time)  Labs Reviewed - No data to display Dg Chest 2 View  06/04/2011  *RADIOLOGY REPORT*  Clinical Data: Increase shortness of breath.  Recent rib fracture.  CHEST - 2 VIEW  Comparison: Chest and rib radiographs 05/27/2011.  Findings: The heart size is normal.  Emphysematous changes again noted.  No focal airspace disease is evident.  The right side rib fractures are again noted.  There is no pneumothorax.  IMPRESSION:  1.  Stable appearance of mildly displaced right posterolateral rib fractures. 2.  No pneumothorax. 3.  Emphysema. 4.  No other acute cardiopulmonary disease.  Original Report Authenticated By: Jamesetta Orleans. MATTERN, M.D.     No diagnosis found.    MDM  Patient's laceration repaired by the mid-level please see her note for this. Patient stable for discharge no signs of respiratory distress        Toy Baker, MD 06/06/11 2134

## 2011-06-06 NOTE — ED Notes (Signed)
Patient transported to CT 

## 2011-06-06 NOTE — ED Notes (Signed)
Pt tripped and fell at home this am. Laceration across his right eyebrow and nose. Multi minor lacerations to his fingers. bandaids inplace on arrival to ED. He tripped and fell while coming into the ED. Denies injury or pain. Unsure if fall was witnesses by staff.

## 2011-06-06 NOTE — ED Notes (Signed)
Pt fell this am and sustained laceration to right brow.  Was ambulating in ED, lost his balance and fell.  Denies any further injury.

## 2011-06-12 ENCOUNTER — Encounter: Payer: Self-pay | Admitting: Internal Medicine

## 2011-06-12 ENCOUNTER — Ambulatory Visit (INDEPENDENT_AMBULATORY_CARE_PROVIDER_SITE_OTHER): Payer: Medicare Other | Admitting: Internal Medicine

## 2011-06-12 VITALS — BP 132/70 | HR 96 | Temp 98.2°F | Ht 73.0 in | Wt 178.4 lb

## 2011-06-12 DIAGNOSIS — S0181XA Laceration without foreign body of other part of head, initial encounter: Secondary | ICD-10-CM

## 2011-06-12 DIAGNOSIS — J209 Acute bronchitis, unspecified: Secondary | ICD-10-CM

## 2011-06-12 DIAGNOSIS — S0180XA Unspecified open wound of other part of head, initial encounter: Secondary | ICD-10-CM

## 2011-06-12 DIAGNOSIS — J441 Chronic obstructive pulmonary disease with (acute) exacerbation: Secondary | ICD-10-CM

## 2011-06-12 MED ORDER — ALBUTEROL SULFATE HFA 108 (90 BASE) MCG/ACT IN AERS: 2.0000 | INHALATION_SPRAY | Freq: Four times a day (QID) | RESPIRATORY_TRACT | Status: DC | PRN

## 2011-06-12 MED ORDER — METHYLPREDNISOLONE ACETATE PF 80 MG/ML IJ SUSP
120.0000 mg | Freq: Once | INTRAMUSCULAR | Status: AC
  Administered 2011-06-12: 120 mg via INTRAMUSCULAR

## 2011-06-12 MED ORDER — AZITHROMYCIN 250 MG PO TABS: ORAL_TABLET | ORAL | Status: AC

## 2011-06-12 MED ORDER — CEFTRIAXONE SODIUM 1 G IJ SOLR
1.0000 g | Freq: Once | INTRAMUSCULAR | Status: AC
  Administered 2011-06-12: 1 g via INTRAMUSCULAR

## 2011-06-12 MED ORDER — PREDNISONE 10 MG PO TABS: 10.0000 mg | ORAL_TABLET | Freq: Every day | ORAL | Status: DC

## 2011-06-12 MED ORDER — HYDROCODONE-HOMATROPINE 5-1.5 MG/5ML PO SYRP: 5.0000 mL | ORAL_SOLUTION | Freq: Four times a day (QID) | ORAL | Status: AC | PRN

## 2011-06-12 NOTE — Assessment & Plan Note (Signed)
Mild to mod, for depomedrol and rocephin IM and predpack for home,  to f/u any worsening symptoms or concerns

## 2011-06-12 NOTE — Patient Instructions (Addendum)
You had the steroid shot and the antibiotic today, and the nebulizer treatment in the office Take all new medications as prescribed - the antibiotic, cough medicine, and prednisone, and Proair inhaler as needed for worsening wheezing Continue all other medications as before, including your inhalers Your stitch was removed today Please keep your appointments with your specialists as you have planned - Dr Maple Hudson

## 2011-06-12 NOTE — Assessment & Plan Note (Signed)
Dec 7 cxr neg, Mild to mod, for antibx course,  to f/u any worsening symptoms or concerns

## 2011-06-14 ENCOUNTER — Encounter: Payer: Self-pay | Admitting: Internal Medicine

## 2011-06-14 DIAGNOSIS — S0181XA Laceration without foreign body of other part of head, initial encounter: Secondary | ICD-10-CM | POA: Insufficient documentation

## 2011-06-14 NOTE — Assessment & Plan Note (Signed)
Right forehead , intact without s/s cellulitis, 1 stitch removed lateral aspect right eyebrow,  to f/u any worsening symptoms or concerns

## 2011-06-14 NOTE — Progress Notes (Signed)
Subjective:    Patient ID: Spencer Montoya, male    DOB: 12/14/1935, 75 y.o.   MRN: [id number]  HPI here after recent fall with stitche to be removed from right eyebrow area, daughter here - thinks there were 4 originally, but now only 1 left, not clear but seems the others "fell out."  Incidentally also - Here with acute onset mild to mod 2-3 days ST, HA, general weakness and malaise, with prod cough greenish sputum, but Pt denies chest pain, increased sob or doe, wheezing, orthopnea, PND, increased LE swelling, palpitations, dizziness or syncope except for wheezing onset today as well.  Noted hypoxic today - 86% after walking in from parking lot, 89% after neb tx in the office, mild sob, pt adamant will not go to ER or hospn.  Overall good compliance with treatment, and good medicine tolerability.  Pt denies new neurological symptoms such as new headache, or facial or extremity weakness or numbness   Pt denies polydipsia, polyuria. Past Medical History  Diagnosis Date  . COPD (chronic obstructive pulmonary disease)     STAGE 2  . Arthritis   . Dysphonia   . Supranuclear palsy 01/20/2011   Past Surgical History  Procedure Date  . Tonsillectomy     reports that he quit smoking about 4 years ago. He has never used smokeless tobacco. He reports that he drinks alcohol. His drug history not on file. family history includes Cancer in an unspecified family member; Diabetes in an unspecified family member; Hypertension in an unspecified family member; and Stroke in his other. No Known Allergies Current Outpatient Prescriptions on File Prior to Visit  Medication Sig Dispense Refill  . aspirin 81 MG tablet Take 81 mg by mouth daily.        . beclomethasone (QVAR) 80 MCG/ACT inhaler Inhale 1 puff into the lungs 2 (two) times daily. Rinse mouth  1 Inhaler  prn  . carbidopa-levodopa (SINEMET CR) 50-200 MG per tablet Take 1 tablet by mouth 3 (three) times daily.  90 tablet  5  . naproxen (NAPROSYN) 500 MG  tablet Take 1 tablet (500 mg total) by mouth 2 (two) times daily with a meal.  30 tablet  0  . naproxen sodium (ANAPROX) 220 MG tablet Take 220 mg by mouth 2 (two) times daily with a meal.        . omeprazole (PRILOSEC) 20 MG capsule Take 20 mg by mouth daily.        . salmeterol (SEREVENT) 50 MCG/DOSE diskus inhaler Inhale 1 puff into the lungs 2 (two) times daily.  1 Inhaler  prn  . tiotropium (SPIRIVA) 18 MCG inhalation capsule Place 1 capsule (18 mcg total) into inhaler and inhale daily.  30 capsule  prn   Review of Systems Review of Systems  Constitutional: Negative for diaphoresis and unexpected weight change.  HENT: Negative for drooling and tinnitus.   Eyes: Negative for photophobia and visual disturbance.  Respiratory: Negative for choking and stridor.   Gastrointestinal: Negative for vomiting and blood in stool.  Genitourinary: Negative for hematuria and decreased urine volume.       Objective:   Physical Exam BP 132/70  Pulse 96  Temp(Src) 98.2 F (36.8 C) (Oral)  Ht 6\' 1"  (1.854 m)  Wt 178 lb 6 oz (80.91 kg)  BMI 23.53 kg/m2  SpO2 86% Physical Exam  VS noted, mild ill appearing Constitutional: Pt appears well-developed and well-nourished.  HENT: Head: Normocephalic.  Right Ear: External ear normal.  Left Ear:  External ear normal.  Bilat tm's mild erythema.  Sinus nontender.  Pharynx mild erythema Eyes: Conjunctivae and EOM are normal. Pupils are equal, round, and reactive to light.  Neck: Normal range of motion. Neck supple.  Cardiovascular: Normal rate and regular rhythm.   Pulmonary/Chest: Effort normal and breath sounds mild decreased with few wheezes.  Neurological: Pt is alert. No cranial nerve deficit.  Skin: Skin is warm. No erythema.  Psychiatric: Pt behavior is normal. Thought content normal.     Assessment & Plan:

## 2011-06-26 ENCOUNTER — Ambulatory Visit (INDEPENDENT_AMBULATORY_CARE_PROVIDER_SITE_OTHER): Payer: Medicare Other | Admitting: Internal Medicine

## 2011-06-26 ENCOUNTER — Encounter: Payer: Self-pay | Admitting: Internal Medicine

## 2011-06-26 VITALS — BP 120/72 | HR 102 | Ht 73.0 in | Wt 171.0 lb

## 2011-06-26 DIAGNOSIS — G231 Progressive supranuclear ophthalmoplegia [Steele-Richardson-Olszewski]: Secondary | ICD-10-CM

## 2011-06-26 DIAGNOSIS — R490 Dysphonia: Secondary | ICD-10-CM

## 2011-06-26 DIAGNOSIS — J3800 Paralysis of vocal cords and larynx, unspecified: Secondary | ICD-10-CM

## 2011-06-26 DIAGNOSIS — J449 Chronic obstructive pulmonary disease, unspecified: Secondary | ICD-10-CM

## 2011-06-26 DIAGNOSIS — G608 Other hereditary and idiopathic neuropathies: Secondary | ICD-10-CM

## 2011-06-26 NOTE — Progress Notes (Signed)
Patient ID: Spencer Montoya, male    DOB: 1936/04/09, 75 y.o.   MRN: [id number]  HPI 01/20/11-  10 yoM former smoker, here with daughter,  seen for pulmonary evaluation at request of daughter who was former patient here.  50 Pack year smoking hx, QS x 5 years. Dx'd COPD stage 2, about 4-5 years ago while living in [place] state. He moved to [place] 1 year ago, and moves here now to live with daughter. Widower, retired from [employer]..  Home O2 used for sleep x 2 years, but this was discontinued on move from [place], off x 1 week, needing to get re-established.  Describes little day to day change in breathing- probably worse in past year. . DOE around the house, but doesn't wake him. Does wheeze,  But controlled with Qvar 80, Serevent, Spiriva. Coughs only swallowing liquids. Swallowing eval reported negative- 09/05/10. Marland Kitchen  Has Supranuclear Palsy "form of Parkinson's", with no recent neurology evaluation. Aphonia  from this is almost total x 2 years- faintly whispers. Had Speech therapy [employer] Limited mobility - uses walker. Denies heart disease Pneumonia x 1, 3-4 years ago. Had pneumovax. Gets flu vax. PFT 03/27/10- FEV1 2.15/ 68%; FEV1/FVC 0.77, No response to BD; TLC 115%; RV/ TLC 132%;  CXR- 03/05/10- NAD  02/20/11- 75 year old former smoker followed for COPD, complicated by Supranuclear palsy/ vocal paresis After last visit, initial PCP visit is pending 02/28/11- he asks I refill carbidopa-levo ER 50-200, 1 TID, pending establishing a PCP. Then we expect PCP will refer to a Neurologist to establish.   . Home O2 started and helps, but he came today without it and resting room air sat 92% is better than 87% last time.  Now using mostly for sleep.  CXR 01/20/11- COPD, NAD.   06/26/11-  75 year old former smoker followed for COPD, complicated by Supranuclear palsy/ vocal paresis.  Daughter here Had flu vaccine. Daughter comments that his voice remains in audible except when he has a  cold, at which time voice gets huskier and he is much easier to understand. Since starting home oxygen he has been sleeping much better. They deny choking with food or drink. Chest x-ray 06/06/2011 was done when he fell and broke ribs.-Stable right posterior/ lateral rib fractures, emphysema.  Review of Systems Constitutional:   No-   weight loss, night sweats, fevers, chills, fatigue, lassitude. HEENT:   No-   headaches, difficulty swallowing, tooth/dental problems, sore throat,                  No-   sneezing, itching, ear ache, nasal congestion, post nasal drip,  CV:  No-   chest pain, orthopnea, PND, swelling in lower extremities, anasarca, dizziness, palpitations GI:  No-   heartburn, indigestion, abdominal pain, nausea, vomiting, diarrhea,                 change in bowel habits, loss of appetite Resp:  No-  excess mucus,               No-  coughing up of blood.              No-   change in color of mucus.   Skin: No-   rash or lesions. GU:  MS:  No-   joint pain or swelling.  No- decreased range of motion.  No- back pain. Psych:  No- change in mood or affect. No depression or anxiety.  No memory loss.  Objective:   Physical Exam General- Alert, Oriented, Affect-appropriate, Distress- none acute;  Medium build; rolling walker. Room air sat 90% Skin- rash-none, lesions- none, excoriation- none Lymphadenopathy- none Head- atraumatic            Eyes- Gross vision intact, PERRLA, conjunctivae clear secretions            Ears- Hearing, canals-normal            Nose- Clear, no-Septal dev, mucus, polyps, erosion, perforation             Throat- Mallampati II , mucosa clear , drainage- none, tonsils- atrophic   +Almost aphonic- faint whisper few words barely audible.  Neck- flexible , trachea midline, no stridor , thyroid nl, carotid no bruit Chest - symmetrical excursion , unlabored           Heart/CV- RRR , no murmur , no gallop  , no rub, nl s1 s2                           -  JVD- none , edema- none, stasis changes- none, varices- none           Lung- clear to P&A, trace crackles on right, wheeze- none, cough- none , dullness-none, rub- none                          Chest wall-  Abd- tender-no, distended-no, bowel sounds-present, HSM- no Br/ Gen/ Rectal- Not done, not indicated Extrem- cyanosis- none, clubbing, none, atrophy- none, strength- nl Neuro- uses a walker. Makes no effort to speak today.

## 2011-06-26 NOTE — Patient Instructions (Signed)
Order- referral [facility] clinic- chronic dysphonia              Referral Dr Modesto Charon, [facility]    Supranuclear Palsy/ vocal cord paresis  Continue present lung meds- Spiriva and Qvar every day, albuterol rescue inhaler if needed

## 2011-06-29 ENCOUNTER — Emergency Department (INDEPENDENT_AMBULATORY_CARE_PROVIDER_SITE_OTHER): Payer: Medicare Other

## 2011-06-29 ENCOUNTER — Emergency Department (HOSPITAL_BASED_OUTPATIENT_CLINIC_OR_DEPARTMENT_OTHER)
Admission: EM | Admit: 2011-06-29 | Discharge: 2011-06-30 | Disposition: A | Discharge status: Home / Self Care | Payer: Medicare Other | Attending: Emergency Medicine | Admitting: Emergency Medicine

## 2011-06-29 ENCOUNTER — Encounter (HOSPITAL_BASED_OUTPATIENT_CLINIC_OR_DEPARTMENT_OTHER): Payer: Self-pay | Admitting: *Deleted

## 2011-06-29 DIAGNOSIS — J4489 Other specified chronic obstructive pulmonary disease: Secondary | ICD-10-CM | POA: Insufficient documentation

## 2011-06-29 DIAGNOSIS — W19XXXA Unspecified fall, initial encounter: Secondary | ICD-10-CM

## 2011-06-29 DIAGNOSIS — S0120XA Unspecified open wound of nose, initial encounter: Secondary | ICD-10-CM | POA: Insufficient documentation

## 2011-06-29 DIAGNOSIS — S0181XA Laceration without foreign body of other part of head, initial encounter: Secondary | ICD-10-CM

## 2011-06-29 DIAGNOSIS — Z79899 Other long term (current) drug therapy: Secondary | ICD-10-CM | POA: Insufficient documentation

## 2011-06-29 DIAGNOSIS — J3800 Paralysis of vocal cords and larynx, unspecified: Secondary | ICD-10-CM | POA: Insufficient documentation

## 2011-06-29 DIAGNOSIS — R51 Headache: Secondary | ICD-10-CM

## 2011-06-29 DIAGNOSIS — G319 Degenerative disease of nervous system, unspecified: Secondary | ICD-10-CM | POA: Insufficient documentation

## 2011-06-29 DIAGNOSIS — Y9229 Other specified public building as the place of occurrence of the external cause: Secondary | ICD-10-CM | POA: Insufficient documentation

## 2011-06-29 DIAGNOSIS — J449 Chronic obstructive pulmonary disease, unspecified: Secondary | ICD-10-CM | POA: Insufficient documentation

## 2011-06-29 DIAGNOSIS — Z8739 Personal history of other diseases of the musculoskeletal system and connective tissue: Secondary | ICD-10-CM | POA: Insufficient documentation

## 2011-06-29 MED ORDER — LIDOCAINE-EPINEPHRINE 2 %-1:100000 IJ SOLN
INTRAMUSCULAR | Status: AC
  Administered 2011-06-29: 23:00:00
  Filled 2011-06-29: qty 1

## 2011-06-29 NOTE — ED Notes (Signed)
MD at bedside for laceration repair.

## 2011-06-29 NOTE — ED Notes (Signed)
Pt fell this Pm while getting out of the car presents  with abrasion to forehead and nose denies LOC N/V  alert and oriented MAE

## 2011-06-29 NOTE — Assessment & Plan Note (Signed)
He has been able to shake off colds without getting in serious trouble this winter. We will continue to watch for intervention when needed.

## 2011-06-29 NOTE — Assessment & Plan Note (Signed)
They would be interested in meeting with Dr Modesto Charon of [facility] for his thoughts and ask for referral.

## 2011-06-29 NOTE — ED Provider Notes (Signed)
History    This chart was scribed for Hilario Quarry, MD, MD by Smitty Pluck. The patient was seen in room MH05 and the patient's care was started at 10:37PM.   CSN: [id number]  Arrival date & time 06/29/11  2029   First MD Initiated Contact with Patient 06/29/11 2232      No chief complaint on file.   (Consider location/radiation/quality/duration/timing/severity/associated sxs/prior treatment) The history is provided by the patient and a relative.   Spencer Montoya is a 75 y.o. male who presents to the Emergency Department complaining of head and facial pain due to fall while trying to get out of the car today. Pt lost balance and fell on concrete in parking lot. Pain has been constant since onset without radiation. Pt has a history of Parkinson disease and arthritis. He takes aspirin.   Past Medical History  Diagnosis Date  . COPD (chronic obstructive pulmonary disease)     STAGE 2  . Arthritis   . Dysphonia   . Supranuclear palsy 01/20/2011    Past Surgical History  Procedure Date  . Tonsillectomy     Family History  Problem Relation Age of Onset  . Diabetes    . Hypertension    . Cancer    . Stroke Other     History  Substance Use Topics  . Smoking status: Former Smoker -- 1.0 packs/day for 50 years    Quit date: 06/30/2006  . Smokeless tobacco: Never Used  . Alcohol Use: Yes     rare      Review of Systems  All other systems reviewed and are negative.   10 Systems reviewed and are negative for acute change except as noted in the HPI.  Allergies  Review of patient's allergies indicates no known allergies.  Home Medications   Current Outpatient Rx  Name Route Sig Dispense Refill  . ALBUTEROL SULFATE HFA 108 (90 BASE) MCG/ACT IN AERS Inhalation Inhale 2 puffs into the lungs every 6 (six) hours as needed for wheezing. 1 Inhaler 1  . ASPIRIN 81 MG PO TABS Oral Take 81 mg by mouth daily.      . BECLOMETHASONE DIPROPIONATE 80 MCG/ACT IN AERS Inhalation  Inhale 1 puff into the lungs 2 (two) times daily. Rinse mouth 1 Inhaler prn  . CARBIDOPA-LEVODOPA ER 50-200 MG PO TBCR Oral Take 1 tablet by mouth 3 (three) times daily. 90 tablet 5  . NAPROXEN SODIUM 220 MG PO TABS Oral Take 440 mg by mouth daily.     Marland Kitchen OMEPRAZOLE 20 MG PO CPDR Oral Take 20 mg by mouth daily.      Marland Kitchen SALMETEROL XINAFOATE 50 MCG/DOSE IN AEPB Inhalation Inhale 1 puff into the lungs 2 (two) times daily. 1 Inhaler prn  . TIOTROPIUM BROMIDE MONOHYDRATE 18 MCG IN CAPS Inhalation Place 1 capsule (18 mcg total) into inhaler and inhale daily. 30 capsule prn    BP 116/59  Pulse 77  Temp(Src) 97.4 F (36.3 C) (Axillary)  Resp 22  Ht 6\' 1"  (1.854 m)  Wt 171 lb (77.565 kg)  BMI 22.56 kg/m2  SpO2 91%  Physical Exam  Nursing note and vitals reviewed. Constitutional: He is oriented to person, place, and time. He appears well-developed and well-nourished. No distress.  HENT:  Head: Normocephalic and atraumatic.       Facial abrasion  Eyes: EOM are normal. Pupils are equal, round, and reactive to light.  Neck: Normal range of motion. Neck supple. No tracheal deviation present.  Cardiovascular:  Normal rate, regular rhythm and normal heart sounds.   Pulmonary/Chest: Effort normal. No respiratory distress.  Abdominal: Soft. He exhibits no distension.  Musculoskeletal: Normal range of motion.       Bilateral knee abrasions.  Neurological: He is alert and oriented to person, place, and time.  Skin: Skin is warm and dry.  Psychiatric: He has a normal mood and affect. His behavior is normal.    ED Course  Procedures (including critical care time)  DIAGNOSTIC STUDIES: Oxygen Saturation is 91% on room air, normal by my interpretation.    COORDINATION OF CARE:    Labs Reviewed - No data to display No results found.   No diagnosis found.    MDM       LACERATION REPAIR Performed by: Hilario Quarry Authorized by: Hilario Quarry Consent: Verbal consent obtained. Risks  and benefits: risks, benefits and alternatives were discussed Consent given by: patient Patient identity confirmed: provided demographic data Prepped and Draped in normal sterile fashion Wound explored  Laceration Location: bridge of nose  Laceration Length: 2cm  No Foreign Bodies seen or palpated  Anesthesia: local infiltration  Local anesthetic: lidocaine 1%  epinephrine  Anesthetic total: 1 ml  Irrigation method: syringe Amount of cleaning: standard  Skin closure: 5 0 prolene  Number of sutures: 4  Technique: simple interrupted  Patient tolerance: Patient tolerated the procedure well with no immediate complications.   Hilario Quarry, MD 06/30/11 [phone]

## 2011-06-29 NOTE — Assessment & Plan Note (Signed)
He doesn't seem to have aspiration or stridor. Interesting that family finds him much easier to understand when he has a cold. If vocal cord edema during viral illness makes a mechanical change, then possibly this could be copied by the vocal cord clinic group at [facility]. He is interested in a referral.

## 2011-06-29 NOTE — ED Notes (Signed)
Patient transported to CT. NAD noted.  

## 2011-06-29 NOTE — ED Notes (Signed)
MD at bedside. 

## 2011-07-06 ENCOUNTER — Emergency Department (INDEPENDENT_AMBULATORY_CARE_PROVIDER_SITE_OTHER): Payer: Medicare Other

## 2011-07-06 ENCOUNTER — Encounter (HOSPITAL_BASED_OUTPATIENT_CLINIC_OR_DEPARTMENT_OTHER): Payer: Self-pay | Admitting: *Deleted

## 2011-07-06 ENCOUNTER — Emergency Department (HOSPITAL_BASED_OUTPATIENT_CLINIC_OR_DEPARTMENT_OTHER)
Admission: EM | Admit: 2011-07-06 | Discharge: 2011-07-06 | Disposition: A | Discharge status: Home / Self Care | Payer: Medicare Other | Attending: Emergency Medicine | Admitting: Emergency Medicine

## 2011-07-06 DIAGNOSIS — Z79899 Other long term (current) drug therapy: Secondary | ICD-10-CM | POA: Insufficient documentation

## 2011-07-06 DIAGNOSIS — S0101XA Laceration without foreign body of scalp, initial encounter: Secondary | ICD-10-CM

## 2011-07-06 DIAGNOSIS — J4489 Other specified chronic obstructive pulmonary disease: Secondary | ICD-10-CM | POA: Insufficient documentation

## 2011-07-06 DIAGNOSIS — G20 Parkinson's disease: Secondary | ICD-10-CM | POA: Insufficient documentation

## 2011-07-06 DIAGNOSIS — T148XXA Other injury of unspecified body region, initial encounter: Secondary | ICD-10-CM

## 2011-07-06 DIAGNOSIS — G20A1 Parkinson's disease without dyskinesia, without mention of fluctuations: Secondary | ICD-10-CM | POA: Insufficient documentation

## 2011-07-06 DIAGNOSIS — S0100XA Unspecified open wound of scalp, initial encounter: Secondary | ICD-10-CM | POA: Insufficient documentation

## 2011-07-06 DIAGNOSIS — S0190XA Unspecified open wound of unspecified part of head, initial encounter: Secondary | ICD-10-CM

## 2011-07-06 DIAGNOSIS — Z8739 Personal history of other diseases of the musculoskeletal system and connective tissue: Secondary | ICD-10-CM | POA: Insufficient documentation

## 2011-07-06 DIAGNOSIS — R51 Headache: Secondary | ICD-10-CM | POA: Insufficient documentation

## 2011-07-06 DIAGNOSIS — Z9181 History of falling: Secondary | ICD-10-CM | POA: Insufficient documentation

## 2011-07-06 DIAGNOSIS — W19XXXA Unspecified fall, initial encounter: Secondary | ICD-10-CM | POA: Insufficient documentation

## 2011-07-06 DIAGNOSIS — Z4802 Encounter for removal of sutures: Secondary | ICD-10-CM | POA: Insufficient documentation

## 2011-07-06 DIAGNOSIS — W1809XA Striking against other object with subsequent fall, initial encounter: Secondary | ICD-10-CM

## 2011-07-06 DIAGNOSIS — J449 Chronic obstructive pulmonary disease, unspecified: Secondary | ICD-10-CM | POA: Insufficient documentation

## 2011-07-06 NOTE — ED Notes (Addendum)
Pt had unwitnessed fall at home this am- has hx of frequent falls- states he fell while trying to turn around while using a walker. Multiple skin tears and abrasions noted to BUE from past falls per family report

## 2011-07-06 NOTE — ED Provider Notes (Signed)
History     CSN: [id number]  Arrival date & time 07/06/11  1405   First MD Initiated Contact with Patient 07/06/11 1454      Chief Complaint  Patient presents with  . Fall  . Head Laceration    (Consider location/radiation/quality/duration/timing/severity/associated sxs/prior treatment) HPI Comments: Pt is history of frequent fall due to his parkinsons:no loc with fall:pt is requesting stitches removed from face that were placed a week ago:pt c/o laceration to the posterior scalp  Patient is a 76 y.o. male presenting with fall. The history is provided by the patient and a relative. A language interpreter was used.  Fall The accident occurred 1 to 2 hours ago. The fall occurred while walking. He landed on carpet. The point of impact was the head. The pain is present in the head. The pain is mild. He was ambulatory at the scene. There was no entrapment after the fall. There was no drug use involved in the accident. There was no alcohol use involved in the accident. Pertinent negatives include no abdominal pain, no bowel incontinence, no vomiting, no headaches and no loss of consciousness. The symptoms are aggravated by activity. He has tried nothing for the symptoms.    Past Medical History  Diagnosis Date  . COPD (chronic obstructive pulmonary disease)     STAGE 2  . Arthritis   . Dysphonia   . Supranuclear palsy 01/20/2011    Past Surgical History  Procedure Date  . Tonsillectomy   . Cataract extraction     Family History  Problem Relation Age of Onset  . Diabetes    . Hypertension    . Cancer    . Stroke Other     History  Substance Use Topics  . Smoking status: Former Smoker -- 1.0 packs/day for 50 years    Quit date: 06/30/2006  . Smokeless tobacco: Never Used  . Alcohol Use: Yes     rare      Review of Systems  Gastrointestinal: Negative for vomiting, abdominal pain and bowel incontinence.  Neurological: Negative for loss of consciousness and headaches.    All other systems reviewed and are negative.    Allergies  Review of patient's allergies indicates no known allergies.  Home Medications   Current Outpatient Rx  Name Route Sig Dispense Refill  . ALBUTEROL SULFATE HFA 108 (90 BASE) MCG/ACT IN AERS Inhalation Inhale 2 puffs into the lungs every 6 (six) hours as needed for wheezing. 1 Inhaler 1  . ASPIRIN 81 MG PO TABS Oral Take 81 mg by mouth daily.      . BECLOMETHASONE DIPROPIONATE 80 MCG/ACT IN AERS Inhalation Inhale 1 puff into the lungs 2 (two) times daily. Rinse mouth 1 Inhaler prn  . CARBIDOPA-LEVODOPA ER 50-200 MG PO TBCR Oral Take 1 tablet by mouth 3 (three) times daily. 90 tablet 5  . NAPROXEN SODIUM 220 MG PO TABS Oral Take 440 mg by mouth daily.     Marland Kitchen OMEPRAZOLE 20 MG PO CPDR Oral Take 20 mg by mouth daily.      Marland Kitchen SALMETEROL XINAFOATE 50 MCG/DOSE IN AEPB Inhalation Inhale 1 puff into the lungs 2 (two) times daily. 1 Inhaler prn  . TIOTROPIUM BROMIDE MONOHYDRATE 18 MCG IN CAPS Inhalation Place 1 capsule (18 mcg total) into inhaler and inhale daily. 30 capsule prn    BP 116/60  Pulse 88  Temp(Src) 97.6 F (36.4 C) (Oral)  Resp 20  Ht 6\' 1"  (1.854 m)  Wt 171 lb (77.565  kg)  BMI 22.56 kg/m2  SpO2 93%  Physical Exam  Nursing note and vitals reviewed. Constitutional: He appears well-developed and well-nourished.  HENT:       Pt has  3 cm laceration to the left posterior scalp  Eyes: Conjunctivae and EOM are normal.  Neck: Neck supple.  Cardiovascular: Normal rate and regular rhythm.   Pulmonary/Chest: Effort normal and breath sounds normal.  Abdominal: Soft. Bowel sounds are normal.  Musculoskeletal: Normal range of motion.  Neurological: He is alert.  Skin:       Pt has abrasion noted to all extremities  Psychiatric: He has a normal mood and affect.    ED Course  LACERATION REPAIR Performed by: Teressa Lower Authorized by: Teressa Lower Consent: Verbal consent obtained. Written consent not  obtained. Risks and benefits: risks, benefits and alternatives were discussed Consent given by: patient Patient understanding: patient does not state understanding of the procedure being performed Patient identity confirmed: verbally with patient Time out: Immediately prior to procedure a "time out" was called to verify the correct patient, procedure, equipment, support staff and site/side marked as required. Body area: head/neck Location details: scalp Laceration length: 3 cm Foreign bodies: no foreign bodies Anesthesia: local infiltration Local anesthetic: lidocaine 2% without epinephrine Irrigation solution: tap water Irrigation method: syringe Amount of cleaning: standard Skin closure: staples Technique: simple Approximation: close Approximation difficulty: simple Patient tolerance: Patient tolerated the procedure well with no immediate complications. Comments: 4 staples placed  SUTURE REMOVAL Performed by: Teressa Lower Authorized by: Teressa Lower Consent: Verbal consent obtained. Written consent not obtained. Risks and benefits: risks, benefits and alternatives were discussed Consent given by: patient Patient understanding: patient states understanding of the procedure being performed Patient identity confirmed: verbally with patient Time out: Immediately prior to procedure a "time out" was called to verify the correct patient, procedure, equipment, support staff and site/side marked as required. Body area: head/neck Location details: nose Wound Appearance: clean Sutures Removed: 5 Facility: sutures placed in this facility Patient tolerance: Patient tolerated the procedure well with no immediate complications.   (including critical care time)  Labs Reviewed - No data to display Ct Head Wo Contrast  07/06/2011  *RADIOLOGY REPORT*  Clinical Data: Larey Seat.  Hit head.  CT HEAD WITHOUT CONTRAST  Technique:  Contiguous axial images were obtained from the base of the  skull through the vertex without contrast.  Comparison: Head CT 06/29/2011.  Findings: The ventricles are normal.  No extra-axial fluid collections are seen.  The brainstem and cerebellum are unremarkable.  No acute intracranial findings such as infarction or hemorrhage.  No mass lesions.  The bony calvarium is intact.  The visualized paranasal sinuses and mastoid air cells are clear.  IMPRESSION: No acute intracranial findings or skull fracture.  Original Report Authenticated By: P. Loralie Champagne, M.D.     1. Fall   2. Scalp laceration   3. Visit for suture removal   4. Abrasion       MDM  Sutures removed without any problem:staples placed:no acute finding on ct:pt at baseline per daughter        Teressa Lower, NP 07/06/11 1556

## 2011-07-06 NOTE — ED Notes (Signed)
Dressing applied to laceration 

## 2011-07-07 ENCOUNTER — Ambulatory Visit: Payer: Medicare Other | Admitting: Internal Medicine

## 2011-07-11 NOTE — ED Provider Notes (Signed)
Evaluation and management procedures were performed by the PA/NP under my supervision/collaboration.    Spencer Montoya D Dorrell Mitcheltree, MD 07/11/11 1716 

## 2011-07-14 ENCOUNTER — Ambulatory Visit (INDEPENDENT_AMBULATORY_CARE_PROVIDER_SITE_OTHER): Payer: Medicare Other | Admitting: Internal Medicine

## 2011-07-14 ENCOUNTER — Encounter: Payer: Self-pay | Admitting: Internal Medicine

## 2011-07-14 VITALS — BP 110/62 | HR 105 | Temp 97.3°F | Wt 172.8 lb

## 2011-07-14 DIAGNOSIS — E785 Hyperlipidemia, unspecified: Secondary | ICD-10-CM

## 2011-07-14 DIAGNOSIS — G608 Other hereditary and idiopathic neuropathies: Secondary | ICD-10-CM

## 2011-07-14 DIAGNOSIS — J449 Chronic obstructive pulmonary disease, unspecified: Secondary | ICD-10-CM

## 2011-07-14 DIAGNOSIS — S0100XA Unspecified open wound of scalp, initial encounter: Secondary | ICD-10-CM

## 2011-07-14 DIAGNOSIS — G231 Progressive supranuclear ophthalmoplegia [Steele-Richardson-Olszewski]: Secondary | ICD-10-CM

## 2011-07-14 DIAGNOSIS — S0101XA Laceration without foreign body of scalp, initial encounter: Secondary | ICD-10-CM | POA: Insufficient documentation

## 2011-07-14 MED ORDER — ATORVASTATIN CALCIUM 10 MG PO TABS: 10.0000 mg | ORAL_TABLET | Freq: Every day | ORAL | Status: DC

## 2011-07-14 NOTE — Assessment & Plan Note (Signed)
stable overall by hx and exam, most recent data reviewed with pt, and pt to continue medical treatment as before  SpO2 Readings from Last 3 Encounters:  07/14/11 91%  07/06/11 93%  06/29/11 91%

## 2011-07-14 NOTE — Progress Notes (Signed)
Subjective:    Patient ID: Spencer Montoya, male    DOB: 1935/11/01, 76 y.o.   MRN: [id number]  HPI  Here after an episode of fall in retropulsion fashion it seems related to the PSP, with laceration to the post scalp, tx with 4 staples, now for f/u to remove,  With laceration intact and healed, no blood, swelling, red, pain or d/c.  Has chronic hoarseness no change, does have neurology f/u planned for feb 7.  Pt denies chest pain, increased sob or doe, wheezing, orthopnea, PND, increased LE swelling, palpitations, dizziness or syncope.  Pt denies new neurological symptoms such as new headache, or facial or extremity weakness or numbness   Pt denies polydipsia, polyuria.  Has been trying to follow lower chol diet.  Pt denies fever, wt loss, night sweats, loss of appetite, or other constitutional symptoms Past Medical History  Diagnosis Date  . COPD (chronic obstructive pulmonary disease)     STAGE 2  . Arthritis   . Dysphonia   . Supranuclear palsy 01/20/2011   Past Surgical History  Procedure Date  . Tonsillectomy   . Cataract extraction     reports that he quit smoking about 5 years ago. He has never used smokeless tobacco. He reports that he drinks alcohol. He reports that he does not use illicit drugs. family history includes Cancer in an unspecified family member; Diabetes in an unspecified family member; Hypertension in an unspecified family member; and Stroke in his other. No Known Allergies Current Outpatient Prescriptions on File Prior to Visit  Medication Sig Dispense Refill  . albuterol (PROVENTIL HFA;VENTOLIN HFA) 108 (90 BASE) MCG/ACT inhaler Inhale 2 puffs into the lungs every 6 (six) hours as needed for wheezing.  1 Inhaler  1  . aspirin 81 MG tablet Take 81 mg by mouth daily.        . beclomethasone (QVAR) 80 MCG/ACT inhaler Inhale 1 puff into the lungs 2 (two) times daily. Rinse mouth  1 Inhaler  prn  . carbidopa-levodopa (SINEMET CR) 50-200 MG per tablet Take 1 tablet by  mouth 3 (three) times daily.  90 tablet  5  . naproxen sodium (ANAPROX) 220 MG tablet Take 440 mg by mouth daily.       Marland Kitchen omeprazole (PRILOSEC) 20 MG capsule Take 20 mg by mouth daily.        . salmeterol (SEREVENT) 50 MCG/DOSE diskus inhaler Inhale 1 puff into the lungs 2 (two) times daily.  1 Inhaler  prn  . tiotropium (SPIRIVA) 18 MCG inhalation capsule Place 1 capsule (18 mcg total) into inhaler and inhale daily.  30 capsule  prn   Review of Systems Review of Systems  Constitutional: Negative for diaphoresis and unexpected weight change.  HENT: Negative for drooling and tinnitus.   Eyes: Negative for photophobia and visual disturbance.  Respiratory: Negative for choking and stridor.   Gastrointestinal: Negative for vomiting and blood in stool.  Genitourinary: Negative for hematuria and decreased urine volume.  Objective:   Physical Exam BP 110/62  Pulse 105  Temp(Src) 97.3 F (36.3 C) (Oral)  Wt 172 lb 12.8 oz (78.382 kg)  SpO2 91% Physical Exam  VS noted, not ill appearing Constitutional: Pt appears well-developed and well-nourished.  HENT: Head: Normocephalic.  Right Ear: External ear normal.  Left Ear: External ear normal.  Eyes: Conjunctivae and EOM are normal. Pupils are equal, round, and reactive to light.  Neck: Normal range of motion. Neck supple.  Cardiovascular: Normal rate and regular rhythm.  Pulmonary/Chest: Effort normal and breath sounds normal.  Neurological: Pt is alert. No cranial nerve deficit.  Skin: Skin is warm. No erythema. Post head laceration intact without sign of cellulitis, drainage, staples removed Psychiatric: Pt behavior is normal. Thought content normal.     Assessment & Plan:

## 2011-07-14 NOTE — Assessment & Plan Note (Signed)
Has appt soon with neurology, feb 7, not sure of name

## 2011-07-14 NOTE — Assessment & Plan Note (Signed)
Due apparently to retropulsion related to PSP;  Staples removed, healing well, no cellulitis

## 2011-07-14 NOTE — Patient Instructions (Signed)
Your staples were removed today Take all new medications as prescribed - the lipitor 10 mg (generic) to lower cholesterol, to reduce risk of stroke and heart disease Continue all other medications as before Please keep your appointments with your specialists as you have planned - Aug 07, 2011 - Neurology Please return in 4 weeks (for blood work to be done that day)

## 2011-07-14 NOTE — Assessment & Plan Note (Signed)
Uncontrolled, most recent data reviewed with pt, and pt to start statin tx, for f/u labs next visit

## 2011-08-07 ENCOUNTER — Ambulatory Visit (INDEPENDENT_AMBULATORY_CARE_PROVIDER_SITE_OTHER): Payer: Medicare Other | Admitting: Neurology

## 2011-08-07 ENCOUNTER — Encounter: Payer: Self-pay | Admitting: Neurology

## 2011-08-07 VITALS — BP 100/62 | HR 76 | Ht 73.0 in | Wt 173.0 lb

## 2011-08-07 DIAGNOSIS — G238 Other specified degenerative diseases of basal ganglia: Secondary | ICD-10-CM

## 2011-08-07 DIAGNOSIS — G231 Progressive supranuclear ophthalmoplegia [Steele-Richardson-Olszewski]: Secondary | ICD-10-CM

## 2011-08-07 MED ORDER — CARBIDOPA-LEVODOPA 25-100 MG PO TABS: 1.0000 | ORAL_TABLET | Freq: Three times a day (TID) | ORAL | Status: DC

## 2011-08-07 NOTE — Patient Instructions (Addendum)
Add to your current dose of Sinemet: Sinemet 25/100 mg. Titrate as follows: add one with your third dose every day for 5 days then add one with the second dose for 5 days then one with the first dose thereafter. This Sinemet 25/100 mg should be titrated up to three times a day and taken with the Sinemet 50/200 mg.  We will refer [facility] to [facility] located at [address] in [place] for physical therapy. They will call you to make the appointment.   [phone].  Your next appointment with Dr. Modesto Charon is scheduled on May 7th at 9:30 am.  Your MRI is scheduled at the [facility] right beside [facility]. The address is [address] [place]. Your appointment is Wednesday, February 13th at 5:00 pm.  Please arrive 15 minutes prior to your scheduled appointment.   [phone].

## 2011-08-07 NOTE — Progress Notes (Signed)
Dear Dr. Jonny Ruiz,  Thank you for having me see Spencer Montoya in consultation today at [facility] for his problem with probable progressive supranuclear palsy as well as hypophonia.  As you may recall, he is a 76 y.o. year old male with a history of COPD who presents with a two year history of falls and progressive hypophonia.  He says he was originally diagnosed with PSP in [place].  He denies an MRI of his brain being done at that time.  He was started on Sinemet 50/200 CR tid but has not noted any significant difference.  He complains of some difficulty swallowing, choking on solids and coughing with liquids.  He thinks his speaking has gotten softer.  He falls, typically backwards up to two times per day.  He denies hallucinations, acting out of dreams or memory problems.  He does not endorse pseudobulbar affect, incontinence or lightheadedness.  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  He did get physical therapy in the past, but has not had it for a year.  Past Medical History  Diagnosis Date  . COPD (chronic obstructive pulmonary disease)     STAGE 2  . Arthritis   . Dysphonia   . Supranuclear palsy 01/20/2011    Past Surgical History  Procedure Date  . Tonsillectomy   . Cataract extraction     History   Social History  . Marital Status: Widowed    Spouse Name: N/A    Number of Children: N/A  . Years of Education: N/A   Occupational History  . RETIRED    Social History Main Topics  . Smoking status: Former Smoker -- 1.0 packs/day for 50 years    Quit date: 06/30/2006  . Smokeless tobacco: Never Used  . Alcohol Use: Yes     rare  . Drug Use: No  . Sexually Active: None   Other Topics Concern  . None   Social History Narrative  . None    Family History  Problem Relation Age of Onset  . Diabetes    . Hypertension    . Cancer    . Stroke Other     Current Outpatient Prescriptions on File Prior to Visit  Medication Sig Dispense Refill  .  albuterol (PROVENTIL HFA;VENTOLIN HFA) 108 (90 BASE) MCG/ACT inhaler Inhale 2 puffs into the lungs every 6 (six) hours as needed for wheezing.  1 Inhaler  1  . aspirin 81 MG tablet Take 81 mg by mouth daily.        Marland Kitchen atorvastatin (LIPITOR) 10 MG tablet Take 1 tablet (10 mg total) by mouth daily.  90 tablet  3  . beclomethasone (QVAR) 80 MCG/ACT inhaler Inhale 1 puff into the lungs 2 (two) times daily. Rinse mouth  1 Inhaler  prn  . carbidopa-levodopa (SINEMET CR) 50-200 MG per tablet Take 1 tablet by mouth 3 (three) times daily.  90 tablet  5  . naproxen sodium (ANAPROX) 220 MG tablet Take 440 mg by mouth daily.       Marland Kitchen omeprazole (PRILOSEC) 20 MG capsule Take 20 mg by mouth daily.        . predniSONE (DELTASONE) 10 MG tablet Take 1 by mouth daily      . salmeterol (SEREVENT) 50 MCG/DOSE diskus inhaler Inhale 1 puff into the lungs 2 (two) times daily.  1 Inhaler  prn  . tiotropium (SPIRIVA) 18 MCG inhalation capsule Place 1 capsule (18 mcg total) into inhaler and inhale daily.  30 capsule  prn    No Known Allergies    ROS:  13 systems were reviewed and are notable for nasal congestion, balance problems, leg pain while walking, arthritis, double vision at times.  All other review of systems are unremarkable.   Examination:  Filed Vitals:   08/07/11 0928  BP: 100/62  Pulse: 76  Height: 6\' 1"  (1.854 m)  Weight: 173 lb (78.472 kg)     In general, well appearing man, difficult to hear due to hypophonia.  Cardiovascular: The patient has a regular rate and rhythm and no carotid bruits.  Fundoscopy:  Difficult to view due to photosensitivity.  Mental status:   MMSE 27/30, 2 points lost for recall, 1 for place(building)  Cranial Nerves: Pupils are equally round and reactive to light. Visual fields full to confrontation.Reveal gaze evoked nystagmus, with saccadic intrusion and severe slowing of saccades particularly downwards.  . Facial sensation and muscles of mastication are intact.  Muscles of facial expression are symmetric. Hearing intact to bilateral finger rub. Tongue protrusion, uvula, palate midline.  Shoulder shrug intact.  ++ axial rigidity.  Motor:  The patient has normal appendicular tone, no pronator drift.  Very mild postural tremor in right arm.  5/5 muscle strength bilaterally.  Reflexes:   Biceps  Triceps Brachioradialis Knee Ankle  Right 2+  2+  2+   3+ 0  Left  2+  2+  2+   3+ 0  Toes down  Coordination:  Normal finger to nose.  No dysdiadokinesia.  Sensation is decreased to temperature and vibration in his appendages, but position intact.  Gait and Station are narrow based, ++ poor postural reflexes..   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  The patient's hypophonia is due to his PSP - supranuclear control of his speaking musculature is abnormal.  He also has other classic signs.  Eye lid opening apraxia, supranuclear eye movement abnormalities with slowed downward saccades overcome by the vestibular ocular reflex.  Severe postural instability.  The patient likely will have worsening dysphagia with his progression.  His cognition appears largely normal, but I would expect him to develop a dementia as well.  Typically PSP does not respond to Sinemet but in a small amount of individuals it can, so I am going to try to add 25/100 tablets TID.  If he does not notice a difference I would likely wean his Sinemet off completely.  I would like to get an MRI of his brain as I would expect the typical midbrain atrophy.  I will also send him for physical therapy.  I may get a swallowing study in the future, depending on his swallowing symptoms.  The patient continues to drive.  I have encouraged him to think about giving this up.  While I don't think he is a danger at this point in time, his immobility is going to worsen, so he will have to give this up later.   We will see the patient back in 3 months.  Thank you for having [place] see Spencer Montoya in consultation.  Feel free to contact me with any questions.  Lupita Raider Modesto Charon, MD [facility], [place] [address] [place], [place] [address] Phone: [phone] Fax: [phone].

## 2011-08-11 ENCOUNTER — Other Ambulatory Visit (INDEPENDENT_AMBULATORY_CARE_PROVIDER_SITE_OTHER): Payer: Medicare Other

## 2011-08-11 ENCOUNTER — Encounter: Payer: Self-pay | Admitting: Internal Medicine

## 2011-08-11 ENCOUNTER — Ambulatory Visit (INDEPENDENT_AMBULATORY_CARE_PROVIDER_SITE_OTHER): Payer: Medicare Other | Admitting: Internal Medicine

## 2011-08-11 VITALS — BP 108/70 | HR 74 | Temp 97.0°F | Wt 174.5 lb

## 2011-08-11 DIAGNOSIS — Z79899 Other long term (current) drug therapy: Secondary | ICD-10-CM

## 2011-08-11 DIAGNOSIS — Z0000 Encounter for general adult medical examination without abnormal findings: Secondary | ICD-10-CM

## 2011-08-11 DIAGNOSIS — E785 Hyperlipidemia, unspecified: Secondary | ICD-10-CM

## 2011-08-11 LAB — LIPID PANEL
HDL: 61.5 mg/dL (ref >39.00)
Triglycerides: 79 mg/dL (ref 0.0–149.0)

## 2011-08-11 LAB — HEPATIC FUNCTION PANEL
Albumin: 4.2 g/dL (ref 3.5–5.2)
Total Protein: 7.1 g/dL (ref 6.0–8.3)

## 2011-08-11 MED ORDER — ATORVASTATIN CALCIUM 20 MG PO TABS: 20.0000 mg | ORAL_TABLET | Freq: Every day | ORAL | Status: DC

## 2011-08-11 NOTE — Patient Instructions (Signed)
Continue all other medications as before Please go to LAB in the [facility]ement for the blood and/or urine tests to be done today - the only thing needed today is cholesterol and liver tests Please call the phone number [phone] (the [employer]Tree System) for results of testing in 2-3 days;  When calling, simply dial the number, and when prompted enter the MRN number above (the Medical Record Number) and the # key, then the message should start. You will be contacted regarding the referral for: screening colonoscopy Please keep your appointments with your specialists as you have planned - Dr Modesto Charon in 3 months Please return in 6 months for your "yearly visit, for yearly labs"

## 2011-08-11 NOTE — Progress Notes (Signed)
Addended by: Corwin Levins on: 08/11/2011 05:39 PM   Modules accepted: Orders

## 2011-08-11 NOTE — Assessment & Plan Note (Signed)
Not charged today, but due for coloonscopy screening, and he agrees, will try to refer

## 2011-08-11 NOTE — Progress Notes (Signed)
Subjective:    Patient ID: Spencer Montoya, male    DOB: 08-Feb-1936, 76 y.o.   MRN: [id number]  HPI  Here to f/u; overall doing ok;  Appears to be tolerating the lipitor well without new c/o fatigue, worsening gait or fall, joint pain,myalgias, cognitive slowing or constipation.  No other new complaints.  Pt denies chest pain, increased sob or doe, wheezing, orthopnea, PND, increased LE swelling, palpitations, dizziness or syncope.  Pt denies new neurological symptoms such as new headache, or facial or extremity weakness or numbness - has seen Dr Modesto Charon recently, with plan for MRI and f/u at 3 mo.   Pt is also due for screening colonsocopy, last approx 6-7 yrs per pt, was supposed to f/u at 5 yrs Past Medical History  Diagnosis Date  . COPD (chronic obstructive pulmonary disease)     STAGE 2  . Arthritis   . Dysphonia   . Supranuclear palsy 01/20/2011   Past Surgical History  Procedure Date  . Tonsillectomy   . Cataract extraction     reports that he quit smoking about 5 years ago. He has never used smokeless tobacco. He reports that he drinks alcohol. He reports that he does not use illicit drugs. family history includes Cancer in an unspecified family member; Diabetes in an unspecified family member; Hypertension in an unspecified family member; and Stroke in his other. No Known Allergies Current Outpatient Prescriptions on File Prior to Visit  Medication Sig Dispense Refill  . aspirin 81 MG tablet Take 81 mg by mouth daily.        Marland Kitchen atorvastatin (LIPITOR) 10 MG tablet Take 1 tablet (10 mg total) by mouth daily.  90 tablet  3  . beclomethasone (QVAR) 80 MCG/ACT inhaler Inhale 1 puff into the lungs 2 (two) times daily. Rinse mouth  1 Inhaler  prn  . carbidopa-levodopa (SINEMET CR) 50-200 MG per tablet Take 1 tablet by mouth 3 (three) times daily.  90 tablet  5  . carbidopa-levodopa (SINEMET) 25-100 MG per tablet Take 1 tablet by mouth 3 (three) times daily. Titrate the 25/100 mg up as  directed.  90 tablet  2  . naproxen sodium (ANAPROX) 220 MG tablet Take 440 mg by mouth daily.       Marland Kitchen omeprazole (PRILOSEC) 20 MG capsule Take 20 mg by mouth daily.        . predniSONE (DELTASONE) 10 MG tablet Take 1 by mouth daily      . salmeterol (SEREVENT) 50 MCG/DOSE diskus inhaler Inhale 1 puff into the lungs 2 (two) times daily.  1 Inhaler  prn  . tiotropium (SPIRIVA) 18 MCG inhalation capsule Place 1 capsule (18 mcg total) into inhaler and inhale daily.  30 capsule  prn  . albuterol (PROVENTIL HFA;VENTOLIN HFA) 108 (90 BASE) MCG/ACT inhaler Inhale 2 puffs into the lungs every 6 (six) hours as needed for wheezing.  1 Inhaler  1   Review of Systems Review of Systems  Constitutional: Negative for diaphoresis and unexpected weight change.  HENT: Negative for drooling and tinnitus.   Eyes: Negative for photophobia and visual disturbance.  Respiratory: Negative for choking and stridor.   Gastrointestinal: Negative for vomiting and blood in stool.  Genitourinary: Negative for hematuria and decreased urine volume.    Objective:   Physical Exam BP 108/70  Pulse 74  Temp(Src) 97 F (36.1 C) (Oral)  Wt 174 lb 8 oz (79.153 kg)  SpO2 92% Physical Exam  VS noted Constitutional: Pt appears well-developed and  well-nourished.  HENT: Head: Normocephalic.  Right Ear: External ear normal.  Left Ear: External ear normal.  Eyes: Conjunctivae and EOM are normal. Pupils are equal, round, and reactive to light.  Neck: Normal range of motion. Neck supple.  Cardiovascular: Normal rate and regular rhythm.   Pulmonary/Chest: Effort normal and breath sounds normal.  Abd:  Soft, NT, non-distended, + BS    Assessment & Plan:

## 2011-08-11 NOTE — Assessment & Plan Note (Signed)
stable overall by hx and exam, t, and pt to continue medical treatment as before, and for lab f/u today

## 2011-08-11 NOTE — Assessment & Plan Note (Signed)
Also for LFT's related to statin use today

## 2011-08-12 ENCOUNTER — Telehealth: Payer: Self-pay

## 2011-08-12 NOTE — Telephone Encounter (Signed)
A user error has taken place: encounter opened in error, closed for administrative reasons.

## 2011-08-13 ENCOUNTER — Ambulatory Visit (HOSPITAL_COMMUNITY)
Admission: RE | Admit: 2011-08-13 | Discharge: 2011-08-13 | Disposition: A | Discharge status: Home / Self Care | Payer: Medicare Other | Source: Ambulatory Visit | Attending: Neurology | Admitting: Neurology

## 2011-08-13 DIAGNOSIS — G238 Other specified degenerative diseases of basal ganglia: Secondary | ICD-10-CM | POA: Insufficient documentation

## 2011-08-13 DIAGNOSIS — G231 Progressive supranuclear ophthalmoplegia [Steele-Richardson-Olszewski]: Secondary | ICD-10-CM

## 2011-08-13 DIAGNOSIS — Z9181 History of falling: Secondary | ICD-10-CM | POA: Insufficient documentation

## 2011-08-13 DIAGNOSIS — I6789 Other cerebrovascular disease: Secondary | ICD-10-CM | POA: Insufficient documentation

## 2011-08-13 DIAGNOSIS — G119 Hereditary ataxia, unspecified: Secondary | ICD-10-CM | POA: Insufficient documentation

## 2011-08-18 ENCOUNTER — Telehealth: Payer: Self-pay | Admitting: Neurology

## 2011-08-18 NOTE — Telephone Encounter (Signed)
Called and spoke with [name], patient's daughter. Information given as per Dr. Modesto Charon below. [name] inquired about the referral for PT for her dad and I told her I would check on that for her. Order re[facility]ed to [facility]. No other issues and/or concerns at this time.

## 2011-08-18 NOTE — Telephone Encounter (Signed)
Message copied by Benay Spice on Mon Aug 18, 2011  5:19 PM ------      Message from: Milas Gain      Created: Sat Aug 16, 2011 10:24 AM       No obvious hummingbird sign.  Does have chronic changes in the midbrain and pons and some midbrain atrophy.            Jan - Could you let Spencer Montoya know that his MRI did show changes that are likely consistent with PSP?  Thx.  - This should not be a surprise ot him.

## 2011-08-21 ENCOUNTER — Ambulatory Visit: Payer: Medicare Other | Attending: Neurology

## 2011-08-21 DIAGNOSIS — Z5189 Encounter for other specified aftercare: Secondary | ICD-10-CM | POA: Insufficient documentation

## 2011-08-21 DIAGNOSIS — R498 Other voice and resonance disorders: Secondary | ICD-10-CM | POA: Insufficient documentation

## 2011-08-21 DIAGNOSIS — Z9181 History of falling: Secondary | ICD-10-CM | POA: Insufficient documentation

## 2011-08-21 DIAGNOSIS — R1310 Dysphagia, unspecified: Secondary | ICD-10-CM | POA: Insufficient documentation

## 2011-08-25 ENCOUNTER — Ambulatory Visit: Payer: Medicare Other | Admitting: Physical Therapy

## 2011-08-27 ENCOUNTER — Ambulatory Visit: Payer: Medicare Other | Admitting: Physical Therapy

## 2011-08-29 ENCOUNTER — Ambulatory Visit: Payer: Medicare Other | Attending: Neurology

## 2011-08-29 DIAGNOSIS — R498 Other voice and resonance disorders: Secondary | ICD-10-CM | POA: Insufficient documentation

## 2011-08-29 DIAGNOSIS — M25559 Pain in unspecified hip: Secondary | ICD-10-CM | POA: Insufficient documentation

## 2011-08-29 DIAGNOSIS — Z5189 Encounter for other specified aftercare: Secondary | ICD-10-CM | POA: Insufficient documentation

## 2011-09-01 ENCOUNTER — Ambulatory Visit: Payer: Medicare Other | Admitting: Internal Medicine

## 2011-09-01 ENCOUNTER — Ambulatory Visit: Payer: Medicare Other | Admitting: Speech Pathology

## 2011-09-01 ENCOUNTER — Ambulatory Visit: Payer: Medicare Other | Admitting: Physical Therapy

## 2011-09-03 ENCOUNTER — Ambulatory Visit: Payer: Medicare Other | Admitting: Speech Pathology

## 2011-09-03 ENCOUNTER — Ambulatory Visit: Payer: Medicare Other | Admitting: Physical Therapy

## 2011-09-08 ENCOUNTER — Encounter: Payer: Medicare Other | Admitting: Speech Pathology

## 2011-09-08 ENCOUNTER — Ambulatory Visit: Payer: Medicare Other | Admitting: Physical Therapy

## 2011-09-08 ENCOUNTER — Ambulatory Visit: Payer: Medicare Other | Admitting: Speech Pathology

## 2011-09-12 ENCOUNTER — Ambulatory Visit: Payer: Medicare Other

## 2011-09-12 ENCOUNTER — Ambulatory Visit: Payer: Medicare Other | Admitting: Physical Therapy

## 2011-09-15 ENCOUNTER — Ambulatory Visit: Payer: Medicare Other | Admitting: Physical Therapy

## 2011-09-15 ENCOUNTER — Ambulatory Visit: Payer: Medicare Other

## 2011-09-17 ENCOUNTER — Ambulatory Visit: Payer: Medicare Other | Admitting: Speech Pathology

## 2011-09-17 ENCOUNTER — Ambulatory Visit: Payer: Medicare Other | Admitting: Physical Therapy

## 2011-09-19 ENCOUNTER — Telehealth: Payer: Self-pay

## 2011-09-19 NOTE — Telephone Encounter (Signed)
Break on walker broke and they called around to see if it could be fixed, but were told it would be more expensive to repair than to get a new one and would like to get a rx for a new one.

## 2011-09-22 NOTE — Telephone Encounter (Signed)
ok of course.

## 2011-09-23 ENCOUNTER — Ambulatory Visit: Payer: Medicare Other | Admitting: Physical Therapy

## 2011-09-23 ENCOUNTER — Ambulatory Visit: Payer: Medicare Other | Admitting: *Deleted

## 2011-09-23 ENCOUNTER — Other Ambulatory Visit: Payer: Self-pay

## 2011-09-23 DIAGNOSIS — G231 Progressive supranuclear ophthalmoplegia [Steele-Richardson-Olszewski]: Secondary | ICD-10-CM

## 2011-09-24 NOTE — Telephone Encounter (Signed)
Rx printed and [name] aware.  She will either call with a fax number or come pick it up.

## 2011-09-25 ENCOUNTER — Ambulatory Visit: Payer: Medicare Other

## 2011-09-25 ENCOUNTER — Ambulatory Visit: Payer: Medicare Other | Admitting: Physical Therapy

## 2011-09-29 ENCOUNTER — Ambulatory Visit: Payer: Medicare Other | Attending: Neurology | Admitting: Physical Therapy

## 2011-09-29 DIAGNOSIS — R498 Other voice and resonance disorders: Secondary | ICD-10-CM | POA: Insufficient documentation

## 2011-09-29 DIAGNOSIS — Z5189 Encounter for other specified aftercare: Secondary | ICD-10-CM | POA: Insufficient documentation

## 2011-09-30 ENCOUNTER — Ambulatory Visit: Payer: Medicare Other

## 2011-10-02 ENCOUNTER — Ambulatory Visit: Payer: Medicare Other

## 2011-10-08 ENCOUNTER — Ambulatory Visit: Payer: Medicare Other

## 2011-10-09 ENCOUNTER — Ambulatory Visit: Payer: Medicare Other

## 2011-10-16 ENCOUNTER — Ambulatory Visit: Payer: Medicare Other

## 2011-10-17 ENCOUNTER — Ambulatory Visit: Payer: Medicare Other

## 2011-11-01 ENCOUNTER — Other Ambulatory Visit: Payer: Self-pay | Admitting: Internal Medicine

## 2011-11-03 ENCOUNTER — Encounter: Payer: Self-pay | Admitting: Gastroenterology

## 2011-11-04 ENCOUNTER — Ambulatory Visit (INDEPENDENT_AMBULATORY_CARE_PROVIDER_SITE_OTHER): Payer: Medicare Other | Admitting: Neurology

## 2011-11-04 ENCOUNTER — Emergency Department (HOSPITAL_COMMUNITY): Payer: Medicare Other

## 2011-11-04 ENCOUNTER — Encounter: Payer: Self-pay | Admitting: Neurology

## 2011-11-04 ENCOUNTER — Emergency Department (INDEPENDENT_AMBULATORY_CARE_PROVIDER_SITE_OTHER)
Admission: EM | Admit: 2011-11-04 | Discharge: 2011-11-04 | Disposition: A | Discharge status: Nursing Home (Includes ICF) | Payer: Medicare Other | Source: Home / Self Care | Attending: Emergency Medicine | Admitting: Emergency Medicine

## 2011-11-04 ENCOUNTER — Emergency Department (HOSPITAL_COMMUNITY)
Admission: EM | Admit: 2011-11-04 | Discharge: 2011-11-04 | Disposition: A | Discharge status: Home / Self Care | Payer: Medicare Other | Attending: Emergency Medicine | Admitting: Emergency Medicine

## 2011-11-04 ENCOUNTER — Encounter (HOSPITAL_COMMUNITY): Payer: Self-pay

## 2011-11-04 ENCOUNTER — Encounter (HOSPITAL_COMMUNITY): Payer: Self-pay | Admitting: *Deleted

## 2011-11-04 VITALS — Wt 173.0 lb

## 2011-11-04 DIAGNOSIS — S0990XA Unspecified injury of head, initial encounter: Secondary | ICD-10-CM

## 2011-11-04 DIAGNOSIS — S80219A Abrasion, unspecified knee, initial encounter: Secondary | ICD-10-CM

## 2011-11-04 DIAGNOSIS — G238 Other specified degenerative diseases of basal ganglia: Secondary | ICD-10-CM

## 2011-11-04 DIAGNOSIS — M542 Cervicalgia: Secondary | ICD-10-CM | POA: Insufficient documentation

## 2011-11-04 DIAGNOSIS — IMO0002 Reserved for concepts with insufficient information to code with codable children: Secondary | ICD-10-CM | POA: Insufficient documentation

## 2011-11-04 DIAGNOSIS — X58XXXA Exposure to other specified factors, initial encounter: Secondary | ICD-10-CM

## 2011-11-04 DIAGNOSIS — S0181XA Laceration without foreign body of other part of head, initial encounter: Secondary | ICD-10-CM

## 2011-11-04 DIAGNOSIS — S50312A Abrasion of left elbow, initial encounter: Secondary | ICD-10-CM

## 2011-11-04 DIAGNOSIS — R296 Repeated falls: Secondary | ICD-10-CM | POA: Insufficient documentation

## 2011-11-04 DIAGNOSIS — R51 Headache: Secondary | ICD-10-CM | POA: Insufficient documentation

## 2011-11-04 DIAGNOSIS — W19XXXA Unspecified fall, initial encounter: Secondary | ICD-10-CM

## 2011-11-04 DIAGNOSIS — S0180XA Unspecified open wound of other part of head, initial encounter: Secondary | ICD-10-CM | POA: Insufficient documentation

## 2011-11-04 DIAGNOSIS — T148XXA Other injury of unspecified body region, initial encounter: Secondary | ICD-10-CM

## 2011-11-04 DIAGNOSIS — G231 Progressive supranuclear ophthalmoplegia [Steele-Richardson-Olszewski]: Secondary | ICD-10-CM

## 2011-11-04 MED ORDER — CARBIDOPA-LEVODOPA ER 50-200 MG PO TBCR: 2.0000 | EXTENDED_RELEASE_TABLET | Freq: Three times a day (TID) | ORAL | Status: DC

## 2011-11-04 NOTE — ED Provider Notes (Signed)
Medical screening examination/treatment/procedure(s) were performed by non-physician practitioner and as supervising physician I was immediately available for consultation/collaboration.  Leslee Home, M.D.   Reuben Likes, MD 11/04/11 1800

## 2011-11-04 NOTE — Progress Notes (Signed)
Dear Dr. Jonny Ruiz,  I saw  Spencer Montoya back in [facility] for his problem with progressive supranuclear palsy.  As you may recall, he is a 76 y.o. year old male with a history of progressive hypophonia, eye movement abnormalities and severe postural instability.  At hist first clinic appt with me I agreed with the previous diagnosis of PSP.  He went to physical therapy as per my directions but does not feel that it helped him at all.  He does feel that the increase in the Sinemet to 300/75 tid helped his falling however.  He denies lightheadedness from it.  He says he is not having any problems swallowing.  He continues to fall 1-2 times per day, despite using a rolling walker.    He also continues to drive.  I have spoken to him before about giving up driving.  He has not had any accidents.  Today he fell in his driveway and hit his head and his left arm.  He suffered a laceration to his head.  He denies depression or anxiety  Medical history, social history, and family history were reviewed and have not changed since the last clinic visit.  Meds: 1.  Sinemet CR 200/50 tid 2.  Sinemet 100/25 tid 3.  atorvastatin 20mg  4.  Spiriva 5.  QVAR 6.  Serevent 7. Prilosec. No Known Allergies  ROS:  13 systems were reviewed and are notable for chronic dyspnea.  All other review of systems are unremarkable.  Exam: . Filed Vitals:   11/04/11 4540  Weight: 173 lb (78.472 kg)    In general, thin appearing man.  H&N:  3cm deep lac on left frontal region.  Extremities:  multiple superficial wounds on left arm with applied bandages   Cranial Nerves:  Extraocular movements - downgaze severely impaired.  Lateral gaze not as slow, but nystagmus when looking right.. Eye lid opening apraxia. Tongue protrusion, uvula, palate midline.  Shoulder shrug intact.  Moderate axial rigidity.  Motor:  Mildly increased tone in arms, no drift and 5/5 muscle strength bilaterally.  Reflexes:   Symmetric.  Coordination:  Normal finger to nose  Gait:  Narrow based, but severe retropulsion.  MRI brain was reviewed and revealed chronic blood products right sylvian fissure -- some midbrain atrophy in sagittal view  Impression/Recommendations:  1.  Progressive supranuclear palsy - this is causing his hypophonia and severe postural instability.  I am going to increase his Sinemet to 2 tabs tid 200/50 CR.  I am surprised it is making a difference as it doesn't usually help PSP.  I am very concerned about him continuing to drive.  I have spoken to him at length about giving this up.  In addition, I have asked him to consider change to a wheelchair given his postural instability.  I asked him to consider bringing his daughter with him to his next appointment given that it would be good that she knows more details about the diagnosis. 2. Head laceration - we have given him directions to [facility] to have this sutured.  We will see the patient back in 3 months.  Lupita Raider Modesto Charon, MD [facility], [place]

## 2011-11-04 NOTE — Discharge Instructions (Signed)
Abrasions Abrasions are skin scrapes. Their treatment depends on how large and deep the abrasion is. Abrasions do not extend through all layers of the skin. A cut or lesion through all skin layers is called a laceration. HOME CARE INSTRUCTIONS   If you were given a dressing, change it at least once a day or as instructed by your caregiver. If the bandage sticks, soak it off with a solution of water or hydrogen peroxide.   Twice a day, wash the area with soap and water to remove all the cream/ointment. You may do this in a sink, under a tub faucet, or in a shower. Rinse off the soap and pat dry with a clean towel. Look for signs of infection (see below).   Reapply cream/ointment according to your caregiver's instruction. This will help prevent infection and keep the bandage from sticking. Telfa or gauze over the wound and under the dressing or wrap will also help keep the bandage from sticking.   If the bandage becomes wet, dirty, or develops a foul smell, change it as soon as possible.   Only take over-the-counter or prescription medicines for pain, discomfort, or fever as directed by your caregiver.  SEEK IMMEDIATE MEDICAL CARE IF:   Increasing pain in the wound.   Signs of infection develop: redness, swelling, surrounding area is tender to touch, or pus coming from the wound.   You have a fever.   Any foul smell coming from the wound or dressing.  Most skin wounds heal within ten days. Facial wounds heal faster. However, an infection may occur despite proper treatment. You should have the wound checked for signs of infection within 24 to 48 hours or sooner if problems arise. If you were not given a wound-check appointment, look closely at the wound yourself on the second day for early signs of infection listed above. MAKE SURE YOU:   Understand these instructions.   Will watch your condition.   Will get help right away if you are not doing well or get worse.  Document Released:  03/26/2005 Document Revised: 06/05/2011 Document Reviewed: 05/20/2011 [facility] Patient Information 2012 [place], [place].  Tissue Adhesive Wound Care A wound can be repaired by using tissue adhesive. Tissue adhesive holds the skin together and allows faster healing. It forms a strong bond on the skin in about 1 minute and reaches its full strength in about 2 or 3 minutes. The adhesive disappears naturally while healing. Follow up is required if your caregiver wants to recheck for infection and to make sure your wound is healing properly.  You may need a tetanus shot if:  You cannot remember when you had your last tetanus shot.   You have never had a tetanus shot.   The injury broke your skin.  If you got a tetanus shot, your arm may swell, get red, and feel warm to the touch. This is common and not a problem. If you need a tetanus shot and you choose not to have one, there is a rare chance of getting tetanus. Sickness from tetanus can be serious. HOME CARE INSTRUCTIONS   Only take over-the-counter or prescription medicines for pain, discomfort, or fever as directed by your caregiver.   Showers are allowed. Do not soak the area containing the tissue adhesive. Do not take baths, swim, or use hot tubs. Do not use any soaps or ointments on the wound until it has healed.   If a bandage (dressing) has been applied, follow your caregiver's instructions for  how often to change the dressing.   Keep the dressing dry if one has been applied.   Do not scratch, pick, or rub the adhesive.   Do not place tape over the adhesive. The adhesive could come off when pulling the tape off.   Protect the wound from further injury until it is healed.   Protect the wound from sun and tanning bed exposure while it is healing and for several weeks after healing.   Keep all follow-up appointments as directed by your caregiver.  SEEK IMMEDIATE MEDICAL CARE IF:   Your wound becomes red, swollen, hot, or tender.     You have increasing pain in the wound.   You have a red streak that goes away from the wound.   You have pus coming from the wound.   You have a fever.   You have shaking chills.   There is a bad smell coming from the wound.   The wound or adhesive breaks open.  MAKE SURE YOU:   Understand these instructions.   Will watch your condition.   Will get help right away if you are not doing well or get worse.  Document Released: 12/10/2000 Document Revised: 06/05/2011 Document Reviewed: 10/20/2010 [facility] Patient Information 2012 [place], [place].  Home Safety and Preventing Falls Falls are a leading cause of injury and while they affect all age groups, falls have greater short-term and long-term impact on older age groups. However, falls should not be a part of life or aging. It is possible for individuals and their families to use preventive measures to significantly decrease the likelihood that anyone, especially an older adult, will fall. There are many simple measures which can make your home safer with respect to preventing falls. The following actions can help reduce falls among all members of your family and are especially important as you age, when your balance, lower limb strength, coordination, and eyesight may be declining. The use of preventive measures will help to reduce you and your family's risk of falls and serious medical consequences. OUTDOORS  Repair cracks and edges of walkways and driveways.   Remove high doorway thresholds and trim shrubbery on the main path into your home.   Ensure there is good outside lighting at main entrances and along main walkways.   Clear walkways of tools, rocks, debris, and clutter.   Check that handrails are not broken and are securely fastened. Both sides of steps should have handrails.   In the garage, be attentive to and clean up grease or oil spills on the cement. This can make the surface extremely slippery.   In winter,  have leaves, snow, and ice cleared regularly.   Use sand or salt on walkways during winter months.  BATHROOM  Install grab bars by the toilet and in the tub and shower.   Use non-skid mats or decals in the tub or shower.   If unable to easily stand unsupported while showering, place a plastic non slip stool in the shower to sit on when needed.   Install night lights.   Keep floors dry and clean up all water on the floor immediately.   Remove soap buildup in tub or shower on a regular basis.   Secure bath mats with non-slip, double-sided rug tape.   Remove tripping hazards from the floors.  BEDROOMS  Install night lights.   Do not use oversized bedding.   Make sure a bedside light is easy to reach.   Keep a telephone  by your bedside.   Make sure that you can get in and out of your bed easily.   Have a firm chair, with side arms, to use for getting dressed.   Remove clutter from around closets.   Store clothing, bed coverings, and other household items where you can reach them comfortably.   Remove tripping hazards from the floor.  LIVING AREAS AND STAIRWAYS  Turn on lights to avoid having to walk through dark areas.   Keep lighting uniform in each room. Place brighter lightbulbs in darker areas, including stairways.   Replace lightbulbs that burn out in stairways immediately.   Arrange furniture to provide for clear pathways.   Keep furniture in the same place.   Eliminate or tape down electrical cables in high traffic areas.   Place handrails on both sides of stairways. Use handrails when going up or down stairs.   Most falls occur on the top or bottom 3 steps.   Fix any loose handrails. Make sure handrails on both sides of the stairways are as long as the stairs.   Remove all walkway obstacles.   Coil or tape electrical cords off to the side of walking areas and out of the way. If using many extension cords, have an electrician put in a new wall outlet to  reduce or eliminate them.   Make sure spills are cleaned up quickly and allow time for drying before walking on freshly cleaned floors.   Firmly attach carpet with non-skid or two-sided tape.   Keep frequently used items within easy reach.   Remove tripping hazards such as throw rugs and clutter in walkways. Never leave objects on stairs.   Get rid of throw rugs elsewhere if possible.   Eliminate uneven floor surfaces.   Make sure couches and chairs are easy to get into and out of.   Check carpeting to make sure it is firmly attached along stairs.   Make repairs to worn or loose carpet promptly.   Select a carpet pattern that does not visually hide the edge of steps.   Avoid placing throw rugs or scatter rugs at the top or bottom of stairways, or properly secure with carpet tape to prevent slippage.   Have an electrician put in a light switch at the top and bottom of the stairs.   Get light switches that glow.   Avoid the following practices: hurrying, inattention, obscured vision, carrying large loads, and wearing slip-on shoes.   Be aware of all pets.  KITCHEN  Place items that are used frequently, such as dishes and food, within easy reach.   Keep handles on pots and pans toward the center of the stove. Use back burners when possible.   Make sure spills are cleaned up quickly and allow time for drying.   Avoid walking on wet floors.   Avoid hot utensils and knives.   Position shelves so they are not too high or low.   Place commonly used objects within easy reach.   If necessary, use a sturdy step stool with a grab bar when reaching.   Make sure electrical cables are out of the way.   Do not use floor polish or wax that makes floors slippery.  OTHER HOME FALL PREVENTION STRATEGIES  Wear low heel or rubber sole shoes that are supportive and fit well.   Wear closed toe shoes.   Know and watch for side effects of medications. Have your caregiver or pharmacist  look at all your  medicines, even over-the-counter medicines. Some medicines can make you sleepy or dizzy.   Exercise regularly. Exercise makes you stronger and improves your balance and coordination.   Limit use of alcohol.   Use eyeglasses if necessary and keep them clean. Have your vision checked every year.   Organize your household in a manner that minimizes the need to walk distances when hurried, or go up and down stairs unnecessarily. For example, have a phone placed on at least each floor of your home. If possible, have a phone beside each sitting or lying area where you spend the most time at home. Keep emergency numbers posted at all phones.   Use non-skid floor wax.   When using a ladder, make sure:   The base is firm.   All ladder feet are on level ground.   The ladder is angled against the wall properly.   When climbing a ladder, face the ladder and hold the ladder rungs firmly.   If reaching, always keep your hips and body weight centered between the rails.   When using a stepladder, make sure it is fully opened and both spreaders are firmly locked.   Do not climb a closed stepladder.   Avoid climbing beyond the second step from the top of a stepladder and the 4th rung from the top of an extension ladder.   Learn and use mobility aids as needed.   Change positions slowly. Arise slowly from sitting and lying positions. Sit on the edge of your bed before getting to your feet.   If you have a history of falls, ask someone to add color or contrast paint or tape to grab bars and handrails in your home.   If you have a history of falls, ask someone to place contrasting color strips on first and last steps.   Install an electrical emergency response system if you need one, and know how to use it.   If you have a medical or other condition that causes you to have limited physical strength, it is important that you reach out to family and friends for occasional help.    FOR CHILDREN:  If young children are in the home, use safety gates. At the top of stairs use screw-mounted gates; use pressure-mounted gates for the bottom of the stairs and doorways between rooms.   Young children should be taught to descend stairs on their stomachs, feet first, and later using the handrail.   Keep drawers fully closed to prevent them from being climbed on or pulled out entirely.   Move chairs, cribs, beds and other furniture away from windows.   Consider installing window guards on windows ground floor and up, unless they are emergency fire exits. Make sure they have easy release mechanisms.   Consider installing special locks that only allow the window to be opened to a certain height.   Never rely on window screens to prevent falls.   Never leave babies alone on changing tables, beds or sofas. Use a changing table that has a restraining strap.   When a child can pull to a standing position, the crib mattress should be adjusted to its lowest position. There should be at least 26 inches between the top rails of the crib drop side and the mattress. Toys, bumper pads, and other objects that can be used as steps to climb out should be removed from the crib.   On bunk beds never allow a child under age 8 to sleep on the  top bunk. For older children, if the upper bunk is not against a wall, use guard rails on both sides. No matter how old a child is, keep the guard rails in place on the top bunk since children roll during sleep. Do not permit horseplay on bunks.   Grass and soil surfaces beneath backyard playground equipment should be replaced with hardwood chips, shredded wood mulch, sand, pea gravel, rubber, crushed stone, or another safer material at depths of at least 9 to 12 inches.   When riding bikes or using skates, skateboards, skis, or snowboards, require children to wear helmets. Look for those that have stickers stating that they meet or exceed safety standards.    Vertical posts or pickets in deck, balcony, and stairway railings should be no more than 3 1/2 inches apart if a young baby will have access to the area. The space between horizontal rails or bars, and between the floor and the first horizontal rail or bar, should be no more than 3 1/2 inches.  Document Released: 06/06/2002 Document Revised: 06/05/2011 Document Reviewed: 04/05/2009 [facility] Patient Information 2012 [place], [place].

## 2011-11-04 NOTE — ED Notes (Signed)
Patient reports he just fell,  He has dressings noted to his forehead on the left side and to his left elbow.   Patient was transferred from ucc for further eval

## 2011-11-04 NOTE — ED Notes (Signed)
Trixie Dredge PA informed of pts presenting complaint and assessment

## 2011-11-04 NOTE — ED Provider Notes (Signed)
History  This chart was scribed for Spencer Booze, MD by [name]. This patient was seen in room STRE1/STRE1 and the patient's care was started at 2:20PM.  CSN: [id number]  Arrival date & time 11/04/11  1137   First MD Initiated Contact with Patient 11/04/11 1420      Chief Complaint  Patient presents with  . Fall     The history is provided by the patient. No language interpreter was used.    Spencer Montoya is a 76 y.o. male brought in by ambulance, who presents to the Emergency Department complaining of a fall after he states he lost his balance. Pt states that he normally has trouble with balance which is why he needs a walker. He states that he was not using a walker when the fall occurred. He has a laceration to his left forehead and multiple abrasions to his knees and left elbow. He was seen at [facility] earlier and transferred here for further evaluation. He denies LOC. He rates his pain a 0 out of 10 currently.  He has a h/o COPD, arthritis and supranuclear palsy. He doesn't know when his last TD vaccine was.  [name] is PCP.  Past Medical History  Diagnosis Date  . COPD (chronic obstructive pulmonary disease)     STAGE 2  . Arthritis   . Dysphonia   . Supranuclear palsy 01/20/2011    Past Surgical History  Procedure Date  . Tonsillectomy   . Cataract extraction     Family History  Problem Relation Age of Onset  . Diabetes    . Hypertension    . Cancer    . Stroke Other     History  Substance Use Topics  . Smoking status: Former Smoker -- 1.0 packs/day for 50 years    Quit date: 06/30/2006  . Smokeless tobacco: Never Used  . Alcohol Use: Yes     rare      Review of Systems  Constitutional: Negative for fever and chills.  Respiratory: Negative for cough and shortness of breath.   Skin: Positive for wound (Laceration to the left forehead).  Neurological: Negative for weakness and headaches.    Allergies  Review of patient's allergies indicates  no known allergies.  Home Medications   Current Outpatient Rx  Name Route Sig Dispense Refill  . ASPIRIN 81 MG PO TABS Oral Take 81 mg by mouth daily.      . ATORVASTATIN CALCIUM 20 MG PO TABS Oral Take 20 mg by mouth daily.    . BECLOMETHASONE DIPROPIONATE 80 MCG/ACT IN AERS Inhalation Inhale 1 puff into the lungs 2 (two) times daily.    Marland Kitchen CARBIDOPA-LEVODOPA ER 50-200 MG PO TBCR Oral Take 2 tablets by mouth 3 (three) times daily.    Marland Kitchen NAPROXEN SODIUM 220 MG PO TABS Oral Take 440 mg by mouth daily as needed. For pain.    Marland Kitchen OMEPRAZOLE 20 MG PO CPDR Oral Take 20 mg by mouth daily.      Marland Kitchen SALMETEROL XINAFOATE 50 MCG/DOSE IN AEPB Inhalation Inhale 1 puff into the lungs 2 (two) times daily.    Marland Kitchen TIOTROPIUM BROMIDE MONOHYDRATE 18 MCG IN CAPS Inhalation Place 18 mcg into inhaler and inhale daily.      Triage Vitals: BP 114/58  Pulse 97  Temp(Src) 97.8 F (36.6 C) (Oral)  Resp 14  SpO2 92%  Physical Exam  Nursing note and vitals reviewed. Constitutional: He is oriented to person, place, and time. He appears well-developed  and well-nourished. No distress.  HENT:  Head: Normocephalic and atraumatic.       Laceration to the left forehead  Eyes: EOM are normal.  Neck: No tracheal deviation present.       Neck is non-tender  Cardiovascular: Normal rate.   Pulmonary/Chest: Effort normal. No respiratory distress.  Musculoskeletal: Normal range of motion.       Abrasions to the left elbow and both knees  Neurological: He is alert and oriented to person, place, and time.       No weakness but has moderate spasticity in all four extremities   Skin: Skin is warm and dry.  Psychiatric: He has a normal mood and affect. His behavior is normal.    ED Course  LACERATION REPAIR Date/Time: 11/04/2011 4:55 PM Performed by: Spencer Montoya Authorized by: Preston Fleeting, Raeleen Winstanley Consent: Verbal consent obtained. Written consent not obtained. Risks and benefits: risks, benefits and alternatives were  discussed Consent given by: patient Patient understanding: patient states understanding of the procedure being performed Patient consent: the patient's understanding of the procedure matches consent given Procedure consent: procedure consent matches procedure scheduled Relevant documents: relevant documents present and verified Site marked: the operative site was marked Imaging studies: imaging studies available Required items: required blood products, implants, devices, and special equipment available Patient identity confirmed: verbally with patient and arm band Time out: Immediately prior to procedure a "time out" was called to verify the correct patient, procedure, equipment, support staff and site/side marked as required. Body area: head/neck Location details: forehead Laceration length: 3 cm Vascular damage: no Patient sedated: no Preparation: Patient was prepped and draped in the usual sterile fashion. Amount of cleaning: standard Debridement: none Degree of undermining: none Skin closure: glue Approximation: close Approximation difficulty: simple Patient tolerance: Patient tolerated the procedure well with no immediate complications.   (including critical care time)  DIAGNOSTIC STUDIES: Oxygen Saturation is 92% on room air, adequate by my interpretation.    COORDINATION OF CARE: 2:23PM-Discussed CT scan of head and TD vaccine with pt and pt agreed. Advised pt to always use walker while getting around due to chronic balance problems. 2:28PM-Checked pt records and found that pt had a TD vaccine last December after being treated for another laceration. I informed pt that I cancelled the TD order due to this finding. 4:42PM-Discussed derma bond to treat laceration on left forehead and pt agreed to derma bond.  LACERATION REPAIR Performed by: Spencer Booze, MD Consent: Verbal consent obtained. Risks and benefits: risks, benefits and alternatives were discussed Patient identity  confirmed: provided demographic data Time out performed prior to procedure Prepped and Draped in normal sterile fashion Wound explored  Laceration Location: Left forehead  Laceration Length: 3.0cm  No Foreign Bodies seen or palpated  Irrigation method: syringe Amount of cleaning: standard  Skin closure: Dermabond  Patient tolerance: Patient tolerated the procedure well with no immediate complications.   Labs Reviewed - No data to display Ct Head Wo Contrast  11/04/2011  *RADIOLOGY REPORT*  Clinical Data:  Fall, head pain, neck pain, imbalance.  CT HEAD WITHOUT CONTRAST CT CERVICAL SPINE WITHOUT CONTRAST  Technique:  Multidetector CT imaging of the head and cervical spine was performed following the standard protocol without intravenous contrast.  Multiplanar CT image reconstructions of the cervical spine were also generated.  Comparison:  08/13/2011 MRI brain.  07/06/2011 CT brain.  CT HEAD  Findings: There is no evidence for acute infarction, intracranial hemorrhage, mass lesion, hydrocephalus, or extra-axial fluid.  Mild to  moderate atrophy.  Chronic microvascular ischemic change.  No skull fracture.  No sutural diastasis.  Vascular calcification. Slight chronic mastoid fluid.  No acute sinus fluid observed. Similar appearance compared with priors.  IMPRESSION: Atrophy and chronic microvascular ischemic change.  No acute intracranial hemorrhage or skull fracture.  CT CERVICAL SPINE  Findings: There is no visible cervical spine fracture or traumatic subluxation. There is no intraspinal hematoma or prevertebral soft tissue swelling.  Disc space narrowing is seen from C4-C7 with osteophyte formation anteriorly.  Calcified central protrusion at C3-4 with mild central canal stenosis.  Mild reversal of the normal cervical lordotic curve could be positional or due to spasm. Calcification of posterior nuchal ligament between C5 and C6.  Lung apices show moderate COPD.  Carotid calcification.  Airway  midline. No neck masses.  IMPRESSION: Spondylosis.  No fracture or traumatic subluxation.  Original Report Authenticated By: Elsie Stain, M.D.   Ct Cervical Spine Wo Contrast  11/04/2011  *RADIOLOGY REPORT*  Clinical Data:  Fall, head pain, neck pain, imbalance.  CT HEAD WITHOUT CONTRAST CT CERVICAL SPINE WITHOUT CONTRAST  Technique:  Multidetector CT imaging of the head and cervical spine was performed following the standard protocol without intravenous contrast.  Multiplanar CT image reconstructions of the cervical spine were also generated.  Comparison:  08/13/2011 MRI brain.  07/06/2011 CT brain.  CT HEAD  Findings: There is no evidence for acute infarction, intracranial hemorrhage, mass lesion, hydrocephalus, or extra-axial fluid.  Mild to moderate atrophy.  Chronic microvascular ischemic change.  No skull fracture.  No sutural diastasis.  Vascular calcification. Slight chronic mastoid fluid.  No acute sinus fluid observed. Similar appearance compared with priors.  IMPRESSION: Atrophy and chronic microvascular ischemic change.  No acute intracranial hemorrhage or skull fracture.  CT CERVICAL SPINE  Findings: There is no visible cervical spine fracture or traumatic subluxation. There is no intraspinal hematoma or prevertebral soft tissue swelling.  Disc space narrowing is seen from C4-C7 with osteophyte formation anteriorly.  Calcified central protrusion at C3-4 with mild central canal stenosis.  Mild reversal of the normal cervical lordotic curve could be positional or due to spasm. Calcification of posterior nuchal ligament between C5 and C6.  Lung apices show moderate COPD.  Carotid calcification.  Airway midline. No neck masses.  IMPRESSION: Spondylosis.  No fracture or traumatic subluxation.  Original Report Authenticated By: Elsie Stain, M.D.     1. Fall   2. Laceration of forehead   3. Abrasion of elbow, left   4. Abrasion of knee       MDM  Fall with forehead laceration, no other  obvious injury.       I personally performed the services described in this documentation, which was scribed in my presence. The recorded information has been reviewed and considered.       Spencer Booze, MD 11/07/11 [phone]

## 2011-11-04 NOTE — ED Notes (Signed)
Pt states he wasn't using his assistive walking device and fell going down his asphalt driveway just prior to arrival.  Denies LOC or vomiting.  Alert, oriented and appropriate.  Approx 1.75 cm laceration to lt temporal area and abrasion to lt anterior forearm.  Small laceration noted to rt eyebrow area. Pt drove himself here.

## 2011-11-04 NOTE — ED Provider Notes (Signed)
History     CSN: [id number]  Arrival date & time 11/04/11  1030   First MD Initiated Contact with Patient 11/04/11 1106      Chief Complaint  Patient presents with  . Head Laceration  . Fall    (Consider location/radiation/quality/duration/timing/severity/associated sxs/prior treatment) HPI Comments: Patient with hx supranuclear palsy which he states makes him chronically off balance reports fall that occurred in the driveway of his home today.  States he did hit his head but denies any LOC.  States he fell because he was off balance.  Denies fevers, recent illness, weakness or numbness of the extremities.  Patient's history is limited by his hx of dysphonia, which causes him to whisper answers but occasionally he does not respond.  Pt is on aspirin, denies being on other blood thinners. Pt denies any pain.    The history is provided by the patient.    Past Medical History  Diagnosis Date  . COPD (chronic obstructive pulmonary disease)     STAGE 2  . Arthritis   . Dysphonia   . Supranuclear palsy 01/20/2011    Past Surgical History  Procedure Date  . Tonsillectomy   . Cataract extraction     Family History  Problem Relation Age of Onset  . Diabetes    . Hypertension    . Cancer    . Stroke Other     History  Substance Use Topics  . Smoking status: Former Smoker -- 1.0 packs/day for 50 years    Quit date: 06/30/2006  . Smokeless tobacco: Never Used  . Alcohol Use: Yes     rare      Review of Systems  Skin: Positive for wound.  Neurological: Negative for syncope, weakness, numbness and headaches.  Psychiatric/Behavioral: Negative for confusion.    Allergies  Review of patient's allergies indicates no known allergies.  Home Medications   Current Outpatient Rx  Name Route Sig Dispense Refill  . ALBUTEROL SULFATE HFA 108 (90 BASE) MCG/ACT IN AERS Inhalation Inhale 2 puffs into the lungs every 6 (six) hours as needed for wheezing. 1 Inhaler 1  . ASPIRIN 81  MG PO TABS Oral Take 81 mg by mouth daily.      . BECLOMETHASONE DIPROPIONATE 80 MCG/ACT IN AERS Inhalation Inhale 1 puff into the lungs 2 (two) times daily. Rinse mouth 1 Inhaler prn  . CARBIDOPA-LEVODOPA ER 50-200 MG PO TBCR Oral Take 2 tablets by mouth 3 (three) times daily. 180 tablet 10  . SALMETEROL XINAFOATE 50 MCG/DOSE IN AEPB Inhalation Inhale 1 puff into the lungs 2 (two) times daily. 1 Inhaler prn  . TIOTROPIUM BROMIDE MONOHYDRATE 18 MCG IN CAPS Inhalation Place 1 capsule (18 mcg total) into inhaler and inhale daily. 30 capsule prn  . ATORVASTATIN CALCIUM 20 MG PO TABS Oral Take 1 tablet (20 mg total) by mouth daily. 90 tablet 3  . NAPROXEN SODIUM 220 MG PO TABS Oral Take 440 mg by mouth daily.     Marland Kitchen OMEPRAZOLE 20 MG PO CPDR Oral Take 20 mg by mouth daily.      Marland Kitchen PREDNISONE 10 MG PO TABS  Take 1 by mouth daily      BP 96/52  Pulse 105  Temp(Src) 98 F (36.7 C) (Oral)  Resp 20  SpO2 90%  Physical Exam  HENT:  Head: Normocephalic. Head is with laceration.    Neurological: He has normal strength. No sensory deficit. He exhibits normal muscle tone. GCS eye subscore is 4. GCS  verbal subscore is 5. GCS motor subscore is 6.       Moves all extremities, sensation and strength intact.  Pt follows commands.  No gross neurological deficits noted, though exam limited by patient's decreased ability to respond verbally to questions.     Skin: Abrasion noted.       ED Course  Procedures (including critical care time)  Labs Reviewed - No data to display No results found.   1. Head injury   2. Fall   3. Laceration       MDM  76 year old man with fall and resulting head injury today in his driveway.  Lacerations over forehead, abrasions vs skin tears of left arm.  Pt is on aspirin.  Fall resulted from being off balance.  Patient to be transferred to ED for further evaluation and treatment, due to patient's age and head injury, also concern for balance issues.            Rise Patience, [place] 11/04/11 1126

## 2011-11-06 ENCOUNTER — Other Ambulatory Visit: Payer: Self-pay | Admitting: Neurology

## 2011-11-22 ENCOUNTER — Emergency Department (INDEPENDENT_AMBULATORY_CARE_PROVIDER_SITE_OTHER)
Admission: EM | Admit: 2011-11-22 | Discharge: 2011-11-22 | Disposition: A | Discharge status: Home / Self Care | Payer: Medicare Other | Source: Home / Self Care

## 2011-11-22 ENCOUNTER — Emergency Department: Admit: 2011-11-22 | Discharge: 2011-11-22 | Disposition: A | Discharge status: Home / Self Care | Payer: Medicare Other

## 2011-11-22 DIAGNOSIS — J441 Chronic obstructive pulmonary disease with (acute) exacerbation: Secondary | ICD-10-CM

## 2011-11-22 DIAGNOSIS — R0602 Shortness of breath: Secondary | ICD-10-CM

## 2011-11-22 MED ORDER — IPRATROPIUM-ALBUTEROL 0.5-2.5 (3) MG/3ML IN SOLN
3.0000 mL | Freq: Once | RESPIRATORY_TRACT | Status: AC
  Administered 2011-11-22: 3 mL via RESPIRATORY_TRACT

## 2011-11-22 MED ORDER — LEVOFLOXACIN 500 MG PO TABS: 500.0000 mg | ORAL_TABLET | Freq: Every day | ORAL | Status: DC

## 2011-11-22 MED ORDER — METHYLPREDNISOLONE SODIUM SUCC 125 MG IJ SOLR
125.0000 mg | Freq: Once | INTRAMUSCULAR | Status: AC
  Administered 2011-11-22: 125 mg via INTRAMUSCULAR

## 2011-11-22 MED ORDER — PREDNISONE 50 MG PO TABS: ORAL_TABLET | ORAL | Status: AC

## 2011-11-22 NOTE — ED Notes (Signed)
Patient has a history of COPD. He presents with shortness of breath and chest congestion.

## 2011-11-22 NOTE — Discharge Instructions (Signed)

## 2011-11-22 NOTE — ED Provider Notes (Signed)
History     CSN: [id number]  Arrival date & time 11/22/11  1338   First MD Initiated Contact with Patient 11/22/11 1404      Chief Complaint  Patient presents with  . Cough   HPI Comments: Decreased communication 2/2 hx/o supra-nuclear palsy. Daughter is communicating for pt.  Baseline hx/o COPD; O2 at night.  Followed by [facility]-Dr. Maple Hudson Increased cough, sputum production and wheezing x 3 days.  No fevers.  Symptoms seemed to worsen this am.  Eating at baseline.      Patient is a 76 y.o. male presenting with cough.  Cough This is a recurrent problem. The current episode started more than 2 days ago. The problem occurs hourly. The cough is productive of sputum. There has been no fever.    Past Medical History  Diagnosis Date  . COPD (chronic obstructive pulmonary disease)     STAGE 2  . Arthritis   . Dysphonia   . Supranuclear palsy 01/20/2011    Past Surgical History  Procedure Date  . Tonsillectomy   . Cataract extraction     Family History  Problem Relation Age of Onset  . Diabetes    . Hypertension    . Cancer    . Stroke Other     History  Substance Use Topics  . Smoking status: Former Smoker -- 1.0 packs/day for 50 years    Quit date: 06/30/2006  . Smokeless tobacco: Never Used  . Alcohol Use: Yes     rare      Review of Systems  Respiratory: Positive for cough.     Allergies  Review of patient's allergies indicates no known allergies.  Home Medications   Current Outpatient Rx  Name Route Sig Dispense Refill  . ASPIRIN 81 MG PO TABS Oral Take 81 mg by mouth daily.      . ATORVASTATIN CALCIUM 20 MG PO TABS Oral Take 20 mg by mouth daily.    . BECLOMETHASONE DIPROPIONATE 80 MCG/ACT IN AERS Inhalation Inhale 1 puff into the lungs 2 (two) times daily.    Marland Kitchen CARBIDOPA-LEVODOPA ER 50-200 MG PO TBCR Oral Take 2 tablets by mouth 3 (three) times daily.    Marland Kitchen CARBIDOPA-LEVODOPA 25-100 MG PO TABS  TAKE 1 TABLET BY MOUTH 3 TIMES DAILY. TITRATE  THE 25/100 MG UP ASDIRECTED. 90 tablet 1  . NAPROXEN SODIUM 220 MG PO TABS Oral Take 440 mg by mouth daily as needed. For pain.    Marland Kitchen OMEPRAZOLE 20 MG PO CPDR Oral Take 20 mg by mouth daily.      Marland Kitchen SALMETEROL XINAFOATE 50 MCG/DOSE IN AEPB Inhalation Inhale 1 puff into the lungs 2 (two) times daily.    Marland Kitchen TIOTROPIUM BROMIDE MONOHYDRATE 18 MCG IN CAPS Inhalation Place 18 mcg into inhaler and inhale daily.      BP 107/65  Pulse 95  Temp(Src) 98 F (36.7 C) (Oral)  Resp 22  Ht 5\' 11"  (1.803 m)  Wt 175 lb (79.379 kg)  BMI 24.41 kg/m2  SpO2 88%  Physical Exam  Constitutional:       Elderly, frail appearing.   HENT:  Head: Normocephalic and atraumatic.  Right Ear: External ear normal.  Left Ear: External ear normal.  Cardiovascular: Normal rate and regular rhythm.   Pulmonary/Chest:       Poor air movement, faint wheezes in apices, faint rales.   Abdominal: Soft.    ED Course  Procedures (including critical care time)  Labs Reviewed - No  data to display No results found.   No diagnosis found.  EKG: normal EKG, normal sinus rhythm, QTc 412.    MDM  COPD exacerbation.  O2 88%-->92% s/p duoneb x 1 Per daughter, this is his O2 baseline.  Solumedrol 125 IM x 1. Prednisone.  Levaquin given comorbidities and O2 dependency.  QTc 412  Resp red flags discussed at length.  Handout given     The patient and/or caregiver has been counseled thoroughly with regard to treatment plan and/or medications prescribed including dosage, schedule, interactions, rationale for use, and possible side effects and they verbalize understanding. Diagnoses and expected course of recovery discussed and will return if not improved as expected or if the condition worsens. Patient and/or caregiver verbalized understanding.               Floydene Flock, MD 11/22/11 1535

## 2011-11-23 NOTE — ED Provider Notes (Signed)
Agree with exam, assessment, and plan.   Lattie Haw, MD 11/23/11 1233

## 2011-11-25 ENCOUNTER — Ambulatory Visit (INDEPENDENT_AMBULATORY_CARE_PROVIDER_SITE_OTHER): Payer: Medicare Other | Admitting: Internal Medicine

## 2011-11-25 ENCOUNTER — Encounter: Payer: Self-pay | Admitting: Internal Medicine

## 2011-11-25 VITALS — BP 112/72 | HR 101 | Temp 97.7°F | Wt 173.0 lb

## 2011-11-25 DIAGNOSIS — J209 Acute bronchitis, unspecified: Secondary | ICD-10-CM

## 2011-11-25 DIAGNOSIS — R296 Repeated falls: Secondary | ICD-10-CM | POA: Insufficient documentation

## 2011-11-25 DIAGNOSIS — W19XXXA Unspecified fall, initial encounter: Secondary | ICD-10-CM

## 2011-11-25 DIAGNOSIS — R079 Chest pain, unspecified: Secondary | ICD-10-CM | POA: Insufficient documentation

## 2011-11-25 MED ORDER — LEVOFLOXACIN 500 MG PO TABS: 500.0000 mg | ORAL_TABLET | Freq: Every day | ORAL | Status: DC

## 2011-11-25 MED ORDER — LEVOFLOXACIN 500 MG PO TABS: 500.0000 mg | ORAL_TABLET | Freq: Every day | ORAL | Status: AC

## 2011-11-25 NOTE — Patient Instructions (Addendum)
Take all new medications as prescribed - the extra 3 days of levaquin in addition to the 7 days you received from the ER Continue all other medications as before Please call or return if it seems you are getting worsening cough, wheezing, or shortness of breath.

## 2011-11-25 NOTE — Assessment & Plan Note (Addendum)
Right side, mild, overall improved symtpoms o/w, and being tx with 10 day levaquin, will forgo cxr today, suspect pleurisy vs mild right pna vs msk

## 2011-11-25 NOTE — Progress Notes (Signed)
Subjective:    Patient ID: Spencer Montoya, male    DOB: 1935/09/19, 76 y.o.   MRN: [id number]  HPI  Here to f/u after ER visit may 25, seen and tx for bronchits with 7 days levaquin, CT neck and head neg for acute after a fall prior to going to ER. Pt with walker today, reports overall feeling much improved in stamina, no further falls or injury, has bandaid to small tear to the right elbow.  Pt denies  increased sob or doe, wheezing, orthopnea, PND, increased LE swelling, palpitations, dizziness or syncope, and cough is less, though has some mild right upper parasternal sharp pleuritic chest pain x 2-3 days as well.  Pt denies new neurological symptoms such as new headache, or facial or extremity weakness or numbness, sees Dr Modesto Charon on regular basis, recent MRI neg for acute stroke.   Pt denies polydipsia, polyuria. Past Medical History  Diagnosis Date  . COPD (chronic obstructive pulmonary disease)     STAGE 2  . Arthritis   . Dysphonia   . Supranuclear palsy 01/20/2011   Past Surgical History  Procedure Date  . Tonsillectomy   . Cataract extraction     reports that he quit smoking about 5 years ago. He has never used smokeless tobacco. He reports that he drinks alcohol. He reports that he does not use illicit drugs. family history includes Cancer in an unspecified family member; Diabetes in an unspecified family member; Hypertension in an unspecified family member; and Stroke in his other. No Known Allergies Current Outpatient Prescriptions on File Prior to Visit  Medication Sig Dispense Refill  . aspirin 81 MG tablet Take 81 mg by mouth daily.        Marland Kitchen atorvastatin (LIPITOR) 20 MG tablet Take 20 mg by mouth daily.      . beclomethasone (QVAR) 80 MCG/ACT inhaler Inhale 1 puff into the lungs 2 (two) times daily.      . carbidopa-levodopa (SINEMET CR) 50-200 MG per tablet Take 2 tablets by mouth 3 (three) times daily.      . carbidopa-levodopa (SINEMET IR) 25-100 MG per tablet TAKE 1 TABLET  BY MOUTH 3 TIMES DAILY. TITRATE THE 25/100 MG UP ASDIRECTED.  90 tablet  1  . naproxen sodium (ANAPROX) 220 MG tablet Take 440 mg by mouth daily as needed. For pain.      . predniSONE (DELTASONE) 50 MG tablet 1 tab daily x 4 days starting 11/23/11  4 tablet  2  . salmeterol (SEREVENT) 50 MCG/DOSE diskus inhaler Inhale 1 puff into the lungs 2 (two) times daily.      Marland Kitchen tiotropium (SPIRIVA) 18 MCG inhalation capsule Place 18 mcg into inhaler and inhale daily.      Marland Kitchen omeprazole (PRILOSEC) 20 MG capsule Take 20 mg by mouth daily.         Review of Systems Review of Systems  Constitutional: Negative for diaphoresis and unexpected weight change.  HENT: Negative for drooling and tinnitus.   Eyes: Negative for photophobia and visual disturbance.  Respiratory: Negative for choking and stridor.   Gastrointestinal: Negative for vomiting and blood in stool.  Genitourinary: Negative for hematuria and decreased urine volume.  Skin: Negative for color change and wound.  Neurological: Negative for tremors and numbness.  Psychiatric/Behavioral: Negative for decreased concentration. The patient is not hyperactive.      Objective:   Physical Exam BP 112/72  Pulse 101  Temp(Src) 97.7 F (36.5 C) (Oral)  Wt 173 lb (78.472 kg)  SpO2 90% Physical Exam  VS noted Constitutional: Pt appears well-developed and well-nourished.  HENT: Head: Normocephalic.  Right Ear: External ear normal.  Left Ear: External ear normal.  Eyes: Conjunctivae and EOM are normal. Pupils are equal, round, and reactive to light.  Neck: Normal range of motion. Neck supple.  Cardiovascular: Normal rate and regular rhythm.   Pulmonary/Chest: Effort normal and breath sounds mild decreased bilat with few RLL crackle only Neurological: Pt is alert. Not confused, o/w not done in detail Skin: Skin is warm. No erythema.  Psychiatric: Pt behavior is normal. Thought content normal.     Assessment & Plan:

## 2011-11-25 NOTE — Assessment & Plan Note (Signed)
Back to baseline, to cont walker use, does not need PT at this time, f/u with neuro as planned

## 2011-11-25 NOTE — Assessment & Plan Note (Signed)
Overall improved, though I wonder if he had very mild pna right side not found on CXR but with pain and rale today - to cont levaquin for total 10 days

## 2011-12-25 ENCOUNTER — Ambulatory Visit (INDEPENDENT_AMBULATORY_CARE_PROVIDER_SITE_OTHER): Payer: Medicare Other | Admitting: Internal Medicine

## 2011-12-25 ENCOUNTER — Encounter: Payer: Self-pay | Admitting: Internal Medicine

## 2011-12-25 VITALS — BP 114/62 | HR 94 | Ht 73.0 in | Wt 173.8 lb

## 2011-12-25 DIAGNOSIS — J449 Chronic obstructive pulmonary disease, unspecified: Secondary | ICD-10-CM

## 2011-12-25 DIAGNOSIS — J3800 Paralysis of vocal cords and larynx, unspecified: Secondary | ICD-10-CM

## 2011-12-25 NOTE — Patient Instructions (Addendum)
We will continue with the same medicines  Please call as needed

## 2011-12-25 NOTE — Progress Notes (Signed)
Patient ID: Spencer Montoya, male    DOB: [birth year], 76 y.o.   MRN: [id number]  HPI 01/20/11-  66 yoM former smoker, here with daughter,  seen for pulmonary evaluation at request of daughter who was former patient here.  50 Pack year smoking hx, QS x 5 years. Dx'd COPD stage 2, about 4-5 years ago while living in [place] state. He moved to [place] 1 year ago, and moves here now to live with daughter. Widower, retired from [employer]..  Home O2 used for sleep x 2 years, but this was discontinued on move from [place], off x 1 week, needing to get re-established.  Describes little day to day change in breathing- probably worse in past year. . DOE around the house, but doesn't wake him. Does wheeze,  But controlled with Qvar 80, Serevent, Spiriva. Coughs only swallowing liquids. Swallowing eval reported negative- 09/05/10. Marland Kitchen  Has Supranuclear Palsy "form of Parkinson's", with no recent neurology evaluation. Aphonia  from this is almost total x 2 years- faintly whispers. Had Speech therapy [facility] Limited mobility - uses walker. Denies heart disease Pneumonia x 1, 3-4 years ago. Had pneumovax. Gets flu vax. PFT 03/27/10- FEV1 2.15/ 68%; FEV1/FVC 0.77, No response to BD; TLC 115%; RV/ TLC 132%;  CXR- 03/05/10- NAD  02/20/11- 89 year old former smoker followed for COPD, complicated by Supranuclear palsy/ vocal paresis After last visit, initial PCP visit is pending 02/28/11- he asks I refill carbidopa-levo ER 50-200, 1 TID, pending establishing a PCP. Then we expect PCP will refer to a Neurologist to establish.   . Home O2 started and helps, but he came today without it and resting room air sat 92% is better than 87% last time.  Now using mostly for sleep.  CXR 01/20/11- COPD, NAD.   06/26/11-  48 year old former smoker followed for COPD, complicated by Supranuclear palsy/ vocal paresis.  Daughter here Had flu vaccine. Daughter comments that his voice remains in audible except when he has a  cold, at which time voice gets huskier and he is much easier to understand. Since starting home oxygen he has been sleeping much better. They deny choking with food or drink. Chest x-ray 06/06/2011 was done when he fell and broke ribs.-Stable right posterior/ lateral rib fractures, emphysema.  12/25/11- 74 year old former smoker followed for COPD, complicated by Supranuclear palsy/ vocal paresis.   His voice has gotten no better after evaluation by Dr. Terri Skains and by the voice clinic at [facility]. Easy dyspnea on exertion is no worse, noticed mainly crossing street briskly or climbing stairs. He denies cough or wheeze. Breathing is not waking. He admits choking a little some times swallowing, which has been my biggest concern with his voice problem, but he minimizes it and indicates he is careful.   COPD assessment test (CAT) score 16/40 CXR 11/22/11- reviewed IMPRESSION:  1. Emphysema.  2. Bilateral lower rib deformities compatible with old fractures.  3. Atherosclerosis.  Original Report Authenticated By: Dellia Cloud, M.D.   Review of Systems-see HPI Constitutional:   No-   weight loss, night sweats, fevers, chills, fatigue, lassitude. HEENT:   No-   headaches, difficulty swallowing, tooth/dental problems, sore throat,                  No-   sneezing, itching, ear ache, nasal congestion, post nasal drip,  CV:  No-   chest pain, orthopnea, PND, swelling in lower extremities, anasarca, dizziness, palpitations GI:  No-   heartburn, indigestion, abdominal pain,  nausea, vomiting, diarrhea,                 change in bowel habits, loss of appetite Resp:  No-  excess mucus, + dyspnea with exertion              No-  coughing up of blood.              No-   change in color of mucus.   Skin: No-   rash or lesions. GU:  MS:  No-   joint pain or swelling.  Psych:  No- change in mood or affect. No depression or anxiety.  No memory loss.  Objective:   Physical Exam General- Alert,  Oriented, Affect-appropriate, Distress- none acute;  Medium build; rolling walker. Room air sat 91% Skin- rash-none, lesions- none, excoriation- none Lymphadenopathy- none Head- atraumatic            Eyes- Gross vision intact, PERRLA, conjunctivae clear secretions            Ears- Hearing, canals-normal            Nose- Clear, no-Septal dev, mucus, polyps, erosion, perforation             Throat- Mallampati II , mucosa clear , drainage- none, tonsils- atrophic   +Almost aphonic- faint whisper few words barely audible.  Neck- flexible , trachea midline, no stridor , thyroid nl, carotid no bruit Chest - symmetrical excursion , unlabored           Heart/CV- RRR , no murmur , no gallop  , no rub, nl s1 s2                           - JVD- none , edema- none, stasis changes- none, varices- none           Lung- clear to P&A, trace crackles on right, wheeze- none, cough- none , dullness-none, rub- none                          Chest wall-  Abd-  Br/ Gen/ Rectal- Not done, not indicated Extrem- cyanosis- none, clubbing, none, atrophy- none, strength- nl Neuro- uses a walker. Makes no effort to speak today.

## 2011-12-28 NOTE — Assessment & Plan Note (Signed)
Clinically controlled. Medications okay.

## 2011-12-28 NOTE — Assessment & Plan Note (Signed)
We are watching for signs of aspiration risk.

## 2012-01-26 ENCOUNTER — Other Ambulatory Visit: Payer: Self-pay | Admitting: Internal Medicine

## 2012-02-04 ENCOUNTER — Ambulatory Visit: Payer: Medicare Other | Admitting: Neurology

## 2012-02-05 ENCOUNTER — Ambulatory Visit (INDEPENDENT_AMBULATORY_CARE_PROVIDER_SITE_OTHER): Payer: Medicare Other | Admitting: Neurology

## 2012-02-05 ENCOUNTER — Encounter: Payer: Self-pay | Admitting: Neurology

## 2012-02-05 VITALS — BP 118/72 | HR 76 | Wt 176.0 lb

## 2012-02-05 DIAGNOSIS — G231 Progressive supranuclear ophthalmoplegia [Steele-Richardson-Olszewski]: Secondary | ICD-10-CM

## 2012-02-05 DIAGNOSIS — G238 Other specified degenerative diseases of basal ganglia: Secondary | ICD-10-CM

## 2012-02-05 NOTE — Progress Notes (Signed)
Dear Dr. Jonny Ruiz,  I saw  Spencer Montoya back in [facility] for his problem with progressive supranuclear palsy.  His main problems with his PSP are his severe postural instability, eyelid opening apraxia, severe hypophonia.  He also has swallowing problems but denies worsening breathing or memory problems.  Against my advice he is still driving.  He is using a rolling walker at home and outside.  He still has frequent falls.  At his last visit I increased his Sinemet to 200/50 CR two tabs three times a day.  He says he might be walking a little better with it.  He is sleeping well and has not lost any weight.  He is choking at times on solids and liquids.  No hallucinations or delusions.  No pseudobulbar affect.  His daughter accompanies him today and she seems well informed about his diagnosis.    Medical history, social history, and family history were reviewed and have not changed since the last clinic visit.  Current Outpatient Prescriptions on File Prior to Visit  Medication Sig Dispense Refill  . aspirin 81 MG tablet Take 81 mg by mouth daily.        . beclomethasone (QVAR) 80 MCG/ACT inhaler Inhale 1 puff into the lungs 2 (two) times daily.      . bisacodyl (DULCOLAX) 5 MG EC tablet Take 5 mg by mouth as needed.      . carbidopa-levodopa (SINEMET CR) 50-200 MG per tablet Take 2 tablets by mouth 3 (three) times daily.      . naproxen sodium (ANAPROX) 220 MG tablet Take 440 mg by mouth daily as needed. For pain.      Marland Kitchen omeprazole (PRILOSEC) 20 MG capsule Take 20 mg by mouth daily.        Marland Kitchen QVAR 80 MCG/ACT inhaler INHALE 1 PUFF INTO THE LUNGS TWO (2) TIMES DAILY. RINSE MOUTH AFTEREACH USE.  1 Inhaler  PRN  . SEREVENT DISKUS 50 MCG/DOSE diskus inhaler INHALE ONE PUFF BY MOUTH TWICE DAILY  60 each  PRN  . SPIRIVA HANDIHALER 18 MCG inhalation capsule INHALE ONE CAPSULE BY MOUTH ONE TIME DAILY  30 each  PRN  . atorvastatin (LIPITOR) 20 MG tablet Take 20 mg by mouth daily.         No Known Allergies  ROS:  13 systems were reviewed and are unremarkable.  Exam: . Filed Vitals:   02/05/12 1000  BP: 118/72  Pulse: 76  Weight: 176 lb (79.833 kg)    In general, well appearing man.  Mental status:   Oriented to place x 5, time x 5.  Cranial Nerves: EOMs slowing of saccades, particularly downward.  Eye lid opening apraxia.  ++ neck rigidity.  Motor: The patient has normal appendicular tone, no pronator drift. Very mild postural tremor in right arm.  5/5 muscle strength bilaterally.  Reflexes:  Biceps Triceps Brachioradialis Knee Ankle  Right 2+ 2+ 2+ 3+ 0  Left 2+ 2+ 2+ 3+ 0  Toes down  Coordination: Normal finger to nose. No dysdiadokinesia.   Gait and Station are mildly wide based, ++ poor postural reflexes..    Impression/Recommendations:  1.  PSP - relatively stable.  I again suggested he stop driving.  I talked to him about thinking about whether he eventually would want a feeding tube.  He will continue to use a walker, and suggested weighting it if necessary.  I have talked to him also about moving to a wheelchair next.  We will continue  the Sinemet 2 tabs of 200/50 CR tid.  I really am not convinced it has helped him, but there is no harm in continuing it for now.  He will follow up with my colleague Dr. Arbutus Leas at his next appt.  Lupita Raider Modesto Charon, MD [facility], [place]

## 2012-02-09 ENCOUNTER — Ambulatory Visit: Payer: Medicare Other | Admitting: Internal Medicine

## 2012-02-09 DIAGNOSIS — Z0289 Encounter for other administrative examinations: Secondary | ICD-10-CM

## 2012-03-07 IMAGING — CT CT HEAD W/O CM
2 series · 16 of 30 positions shown, 18 images · non-contrast
Comparison: 06/06/2011

CT HEAD

CLINICAL DATA: Head and facial pain after loss of balance and
fall.

CT HEAD WITHOUT CONTRAST
CT MAXILLOFACIAL WITHOUT CONTRAST
TECHNIQUE: Multidetector CT imaging of the head and maxillofacial
structures were performed using the standard protocol without
intravenous contrast. Multiplanar CT image reconstructions of the
maxillofacial structures were also generated.

[Series 2: head 4.8 h37s · axial · 0.44mm/px · z∈[-112,+5]mm · 8 of 32 slices shown, 10 images]
[im 4/32  brain]
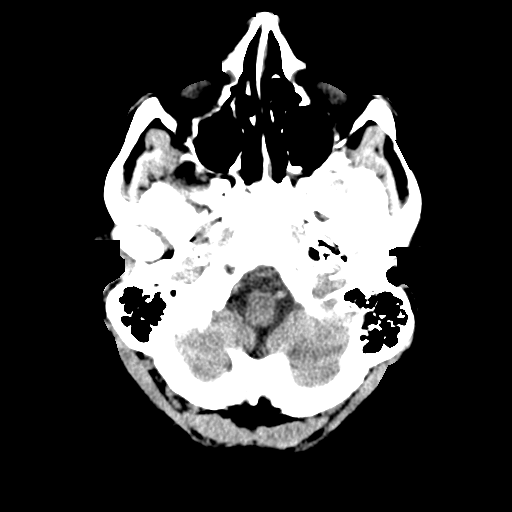
[im 4/32  bone]
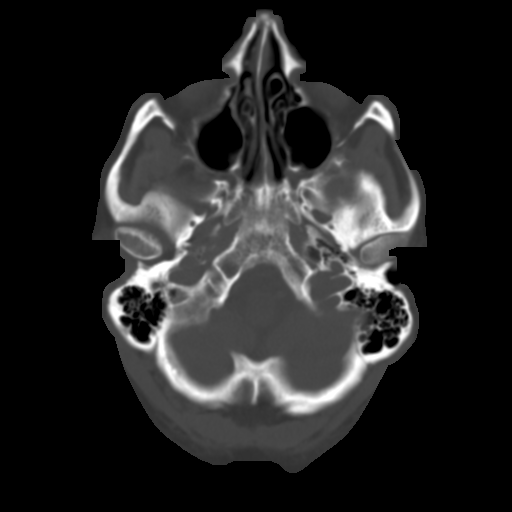
[im 7/32  brain]
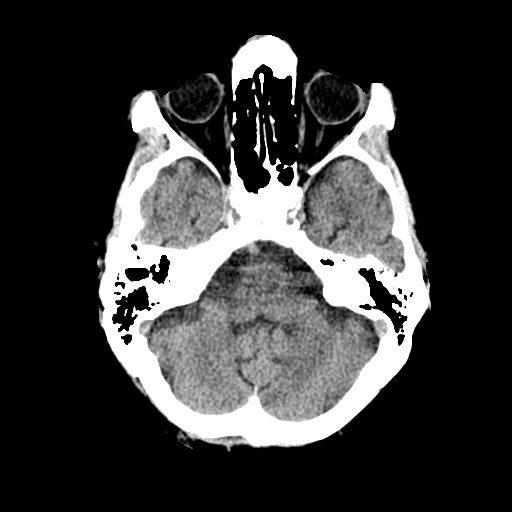
[im 11/32  brain]
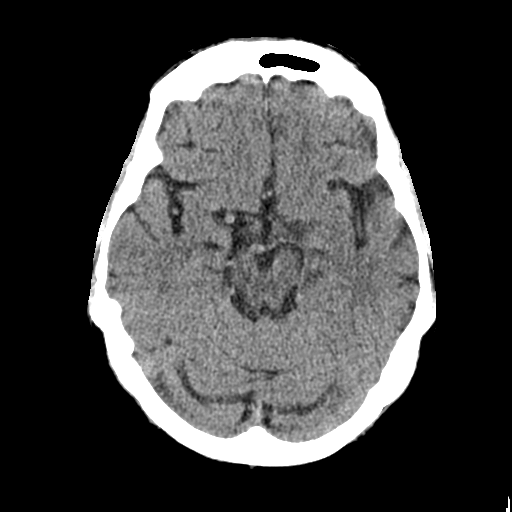
[im 14/32  brain]
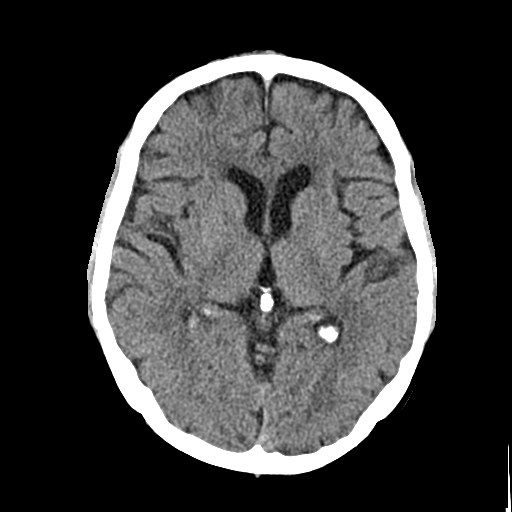
[im 18/32  brain]
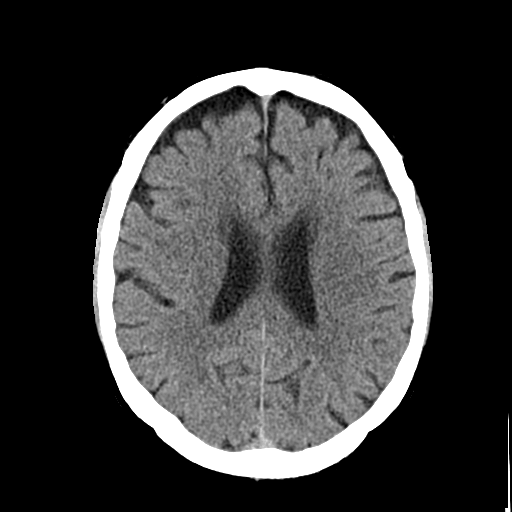
[im 18/32  bone]
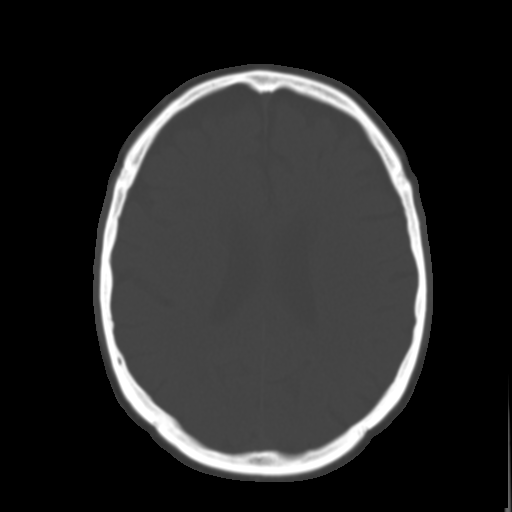
[im 21/32  brain]
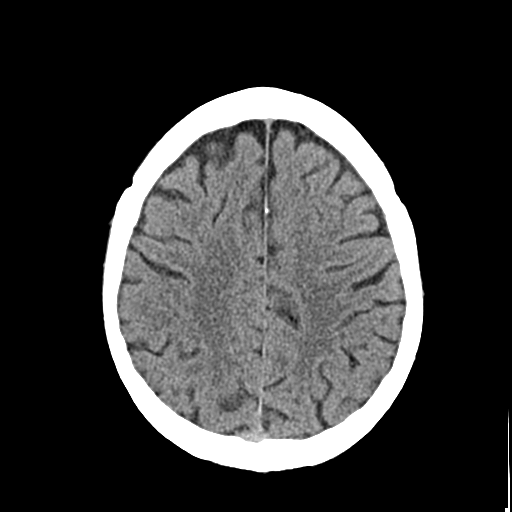
[im 25/32  brain]
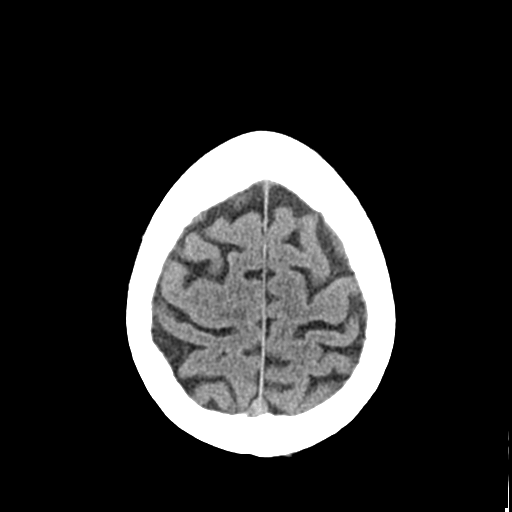
[im 28/32  brain]
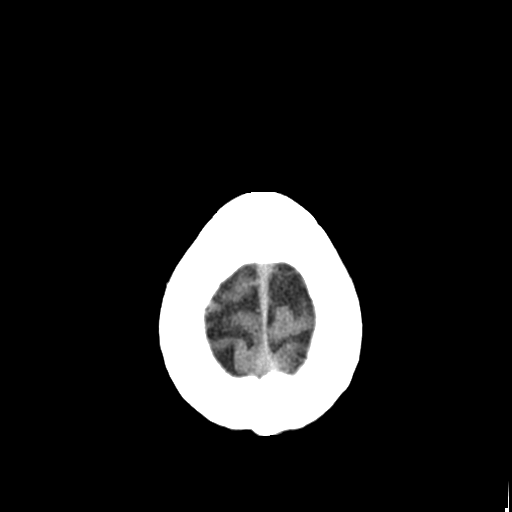

[Series 3: head 2.4 h60s bone · axial · 0.44mm/px · z∈[-113,+8]mm · 8 of 64 slices shown]
[im 7/64  bone]
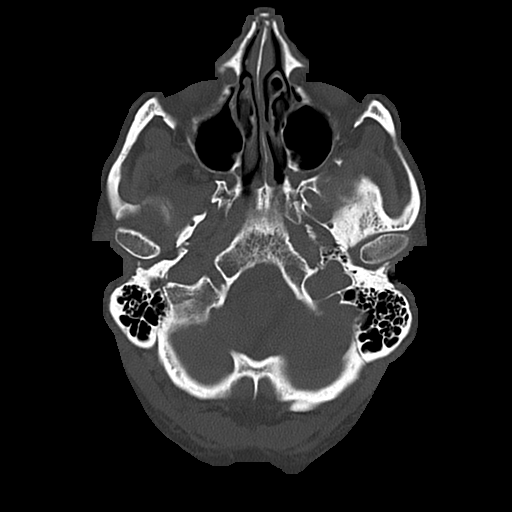
[im 14/64  bone]
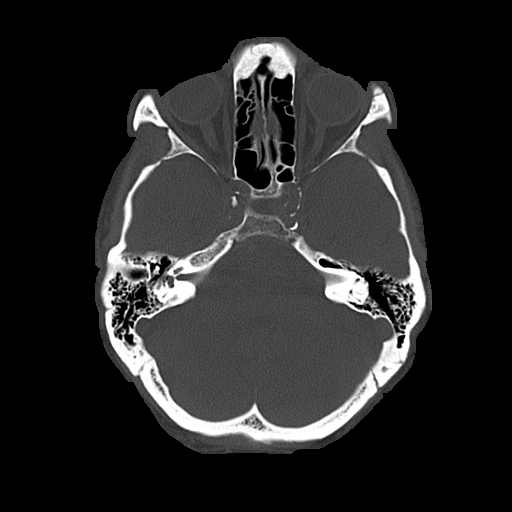
[im 20/64  bone]
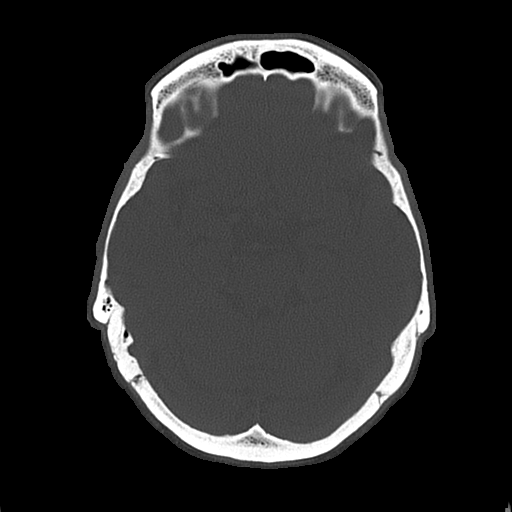
[im 27/64  bone]
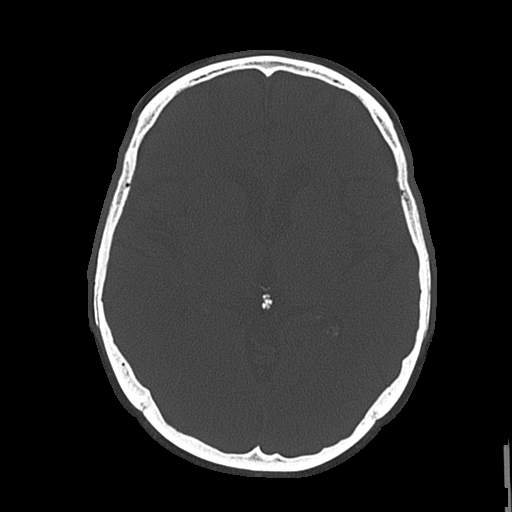
[im 37/64  bone]
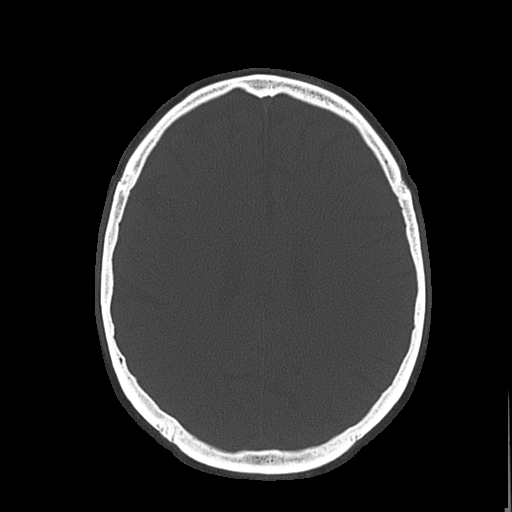
[im 44/64  bone]
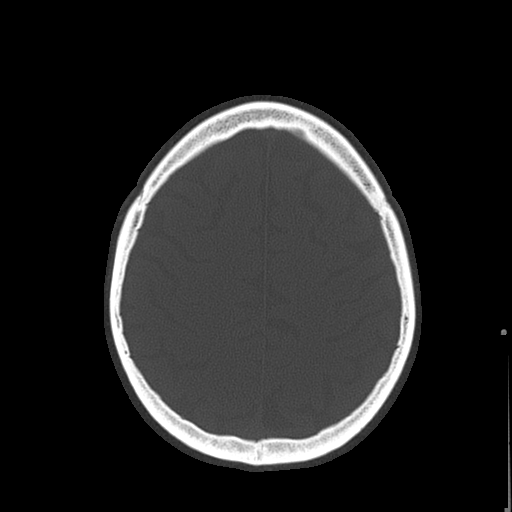
[im 50/64  bone]
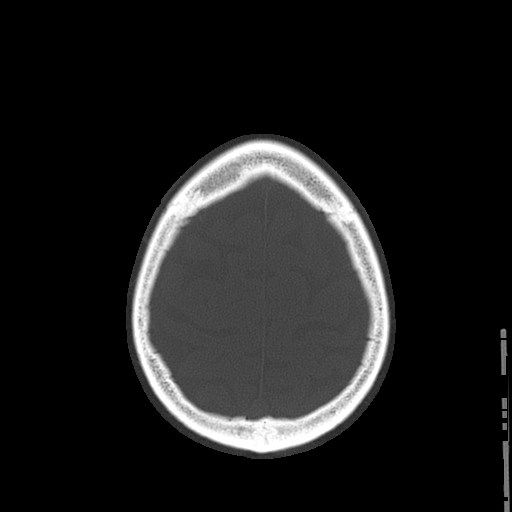
[im 57/64  bone]
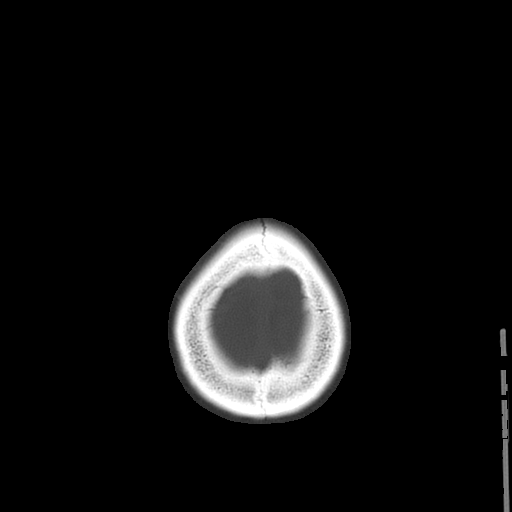

[16 of 30 positions shown; findings below may reference images not displayed]

FINDINGS: Mild diffuse cerebral atrophy.  Scattered low attenuation
changes in the deep white matter consistent with small vessel
ischemia.  No mass effect or midline shift.  No abnormal extra-
axial fluid collections.  The gray-white matter junctions are
distinct.  The basal cisterns are not effaced.  Mild low
attenuation change in the pons which might represent streak
artifact but ischemia is not excluded.  No evidence of acute
intracranial hemorrhage.  No depressed skull fractures.  Visualized
paranasal sinuses are not opacified.  No significant change since
previous study.
IMPRESSION: Mild diffuse atrophy and small vessel ischemic changes.  No
evidence of acute intracranial hemorrhage, mass lesion, or acute
infarct.

CT MAXILLOFACIAL
FINDINGS: The paranasal sinuses are clear.  No acute air-fluid
levels demonstrated.  Mild irregularity of the nasal bones is
stable since the previous study.  No acute nasal fractures are
identified.  The nasal septum is predominately midline without
thickening.  The globes and extraocular muscles appear intact and
symmetrical.  The orbital rims, maxillary antral walls, zygomatic
arches, pterygoid plates, mandibles, and temporomandibular joints
appear intact.  No evidence of acute fracture or subluxation.
Vascular calcifications.  No focal bone lesion or bone destruction.
Incidental note of degenerative changes in the cervical spine.
IMPRESSION: No acute displaced orbital, facial, or facial bone fractures
identified.

## 2012-05-04 ENCOUNTER — Encounter: Payer: Self-pay | Admitting: Internal Medicine

## 2012-05-04 ENCOUNTER — Other Ambulatory Visit (INDEPENDENT_AMBULATORY_CARE_PROVIDER_SITE_OTHER): Payer: Medicare Other

## 2012-05-04 ENCOUNTER — Ambulatory Visit (INDEPENDENT_AMBULATORY_CARE_PROVIDER_SITE_OTHER): Payer: Medicare Other | Admitting: Internal Medicine

## 2012-05-04 VITALS — BP 106/68 | HR 78 | Temp 97.0°F | Ht 73.0 in | Wt 175.0 lb

## 2012-05-04 DIAGNOSIS — R296 Repeated falls: Secondary | ICD-10-CM

## 2012-05-04 DIAGNOSIS — J449 Chronic obstructive pulmonary disease, unspecified: Secondary | ICD-10-CM

## 2012-05-04 DIAGNOSIS — N320 Bladder-neck obstruction: Secondary | ICD-10-CM

## 2012-05-04 DIAGNOSIS — D126 Benign neoplasm of colon, unspecified: Secondary | ICD-10-CM

## 2012-05-04 DIAGNOSIS — E785 Hyperlipidemia, unspecified: Secondary | ICD-10-CM

## 2012-05-04 DIAGNOSIS — K635 Polyp of colon: Secondary | ICD-10-CM

## 2012-05-04 DIAGNOSIS — W19XXXA Unspecified fall, initial encounter: Secondary | ICD-10-CM

## 2012-05-04 DIAGNOSIS — Z23 Encounter for immunization: Secondary | ICD-10-CM

## 2012-05-04 DIAGNOSIS — G238 Other specified degenerative diseases of basal ganglia: Secondary | ICD-10-CM

## 2012-05-04 DIAGNOSIS — G231 Progressive supranuclear ophthalmoplegia [Steele-Richardson-Olszewski]: Secondary | ICD-10-CM

## 2012-05-04 LAB — HEPATIC FUNCTION PANEL
Albumin: 4.2 g/dL (ref 3.5–5.2)
Total Protein: 6.9 g/dL (ref 6.0–8.3)

## 2012-05-04 LAB — URINALYSIS, ROUTINE W REFLEX MICROSCOPIC
Bilirubin Urine: NEGATIVE
Hgb urine dipstick: NEGATIVE
Leukocytes, UA: NEGATIVE
Nitrite: NEGATIVE
Urobilinogen, UA: 0.2 (ref 0.0–1.0)

## 2012-05-04 LAB — BASIC METABOLIC PANEL
CO2: 24 mEq/L (ref 19–32)
Calcium: 9.1 mg/dL (ref 8.4–10.5)
Creatinine, Ser: 1.2 mg/dL (ref 0.4–1.5)
Glucose, Bld: 98 mg/dL (ref 70–99)

## 2012-05-04 LAB — CBC WITH DIFFERENTIAL/PLATELET
Basophils Relative: 0.4 % (ref 0.0–3.0)
Eosinophils Absolute: 0.2 10*3/uL (ref 0.0–0.7)
Eosinophils Relative: 1.3 % (ref 0.0–5.0)
HCT: 46.1 % (ref 39.0–52.0)
Lymphs Abs: 2.1 10*3/uL (ref 0.7–4.0)
MCHC: 32.8 g/dL (ref 30.0–36.0)
MCV: 93.9 fl (ref 78.0–100.0)
Monocytes Absolute: 1.2 10*3/uL — ABNORMAL HIGH (ref 0.1–1.0)
Platelets: 264 10*3/uL (ref 150.0–400.0)
RBC: 4.91 Mil/uL (ref 4.22–5.81)

## 2012-05-04 LAB — PSA: PSA: 2.12 ng/mL (ref 0.10–4.00)

## 2012-05-04 LAB — TSH: TSH: 1.68 u[IU]/mL (ref 0.35–5.50)

## 2012-05-04 NOTE — Assessment & Plan Note (Signed)
Pt declines further statin due to risk of worsening falls

## 2012-05-04 NOTE — Assessment & Plan Note (Signed)
Pt recalls last colonoscopy approx 6 yrs ago outside [place], had one polyp, and was asked to f/u at 5 yrs; he is wanting to do this, will refer to GI for consideration of f/u colonoscopy

## 2012-05-04 NOTE — Patient Instructions (Addendum)
You had the flu shot today Continue all other medications as before Please have the pharmacy call with any refills you may need. Please go to LAB in the Basement for the blood and/or urine tests to be done today You will be contacted by phone if any changes need to be made immediately.  Otherwise, you will receive a letter about your results with an explanation. Please remember to sign up for My Chart at your earliest convenience, as this will be important to you in the future with finding out test results. You will be contacted regarding the referral for: Gastroenterology for office visit to discuss having the follow up colonoscopy Please keep your appointments with your specialists as you have planned - Neurology next week as planned Please return in 1 year for your yearly visit, or sooner if needed

## 2012-05-04 NOTE — Assessment & Plan Note (Signed)
I think could possibly benefit from scooter, tends to fall daily, but he is stoic and declines, has f/u with neurology next wk

## 2012-05-04 NOTE — Assessment & Plan Note (Signed)
After d/w pt, asks for screening for PSA as well

## 2012-05-04 NOTE — Assessment & Plan Note (Signed)
To f/u with new neurology next wk

## 2012-05-04 NOTE — Assessment & Plan Note (Signed)
stable overall by hx and exam, most recent data reviewed with pt, and pt to continue medical treatment as before SpO2 Readings from Last 3 Encounters:  05/04/12 92%  12/25/11 91%  11/25/11 90%

## 2012-05-04 NOTE — Progress Notes (Signed)
Subjective:    Patient ID: Spencer Montoya, male    DOB: 29-Jun-1936, 76 y.o.   MRN: [id number]  HPI  Here with daughter, overall doing ok.  Has f/u with new neurologist next wk, does tend to fall daily, pt rather stoic, uses walker at home, will use the scooters if available at stores.  Denies worsening depressive symptoms, suicidal ideation, or panic, though has ongoing anxiety, not increased recently.   No longer taking the statin, I suspect stopped due to ? Worsening weakness and tendency to fall.  Pt denies new neurological symptoms such as new headache, or stroke like facial or extremity weakness.   Pt denies polydipsia, polyuria,   Pt states overall good compliance with meds, trying to follow lower cholesterol diet.   Pt denies fever, wt loss, night sweats, loss of appetite, or other constitutional symptoms  No new complaints Past Medical History  Diagnosis Date  . COPD (chronic obstructive pulmonary disease)     STAGE 2  . Arthritis   . Dysphonia   . Supranuclear palsy 01/20/2011   Past Surgical History  Procedure Date  . Tonsillectomy   . Cataract extraction     reports that he quit smoking about 5 years ago. He has never used smokeless tobacco. He reports that he drinks alcohol. He reports that he does not use illicit drugs. family history includes Cancer in an unspecified family member; Diabetes in an unspecified family member; Hypertension in an unspecified family member; and Stroke in his other. No Known Allergies Current Outpatient Prescriptions on File Prior to Visit  Medication Sig Dispense Refill  . aspirin 81 MG tablet Take 81 mg by mouth daily.        . carbidopa-levodopa (SINEMET CR) 50-200 MG per tablet Take 2 tablets by mouth 3 (three) times daily.      . naproxen sodium (ANAPROX) 220 MG tablet Take 440 mg by mouth daily as needed. For pain.      Marland Kitchen omeprazole (PRILOSEC) 20 MG capsule Take 20 mg by mouth daily.        . SEREVENT DISKUS 50 MCG/DOSE diskus inhaler INHALE ONE  PUFF BY MOUTH TWICE DAILY  60 each  PRN  . SPIRIVA HANDIHALER 18 MCG inhalation capsule INHALE ONE CAPSULE BY MOUTH ONE TIME DAILY  30 each  PRN  . beclomethasone (QVAR) 80 MCG/ACT inhaler Inhale 1 puff into the lungs 2 (two) times daily.      . bisacodyl (DULCOLAX) 5 MG EC tablet Take 5 mg by mouth as needed.      Marland Kitchen QVAR 80 MCG/ACT inhaler INHALE 1 PUFF INTO THE LUNGS TWO (2) TIMES DAILY. RINSE MOUTH AFTEREACH USE.  1 Inhaler  PRN   Review of Systems  Constitutional: Negative for diaphoresis and unexpected weight change.  HENT: Negative for tinnitus.   Eyes: Negative for photophobia and visual disturbance.  Respiratory: Negative for choking and stridor.   Gastrointestinal: Negative for vomiting and blood in stool.  Genitourinary: Negative for hematuria and decreased urine volume.  Musculoskeletal: Negative for acute joint pain Skin: Negative for color change and wound.  Neurological: Negative for numbness.  Psychiatric/Behavioral: Negative for decreased concentration. The patient is not hyperactive.       Objective:   Physical Exam BP 106/68  Pulse 78  Temp 97 F (36.1 C) (Oral)  Ht 6\' 1"  (1.854 m)  Wt 175 lb (79.379 kg)  BMI 23.09 kg/m2  SpO2 92% Physical Exam  VS noted, not ill appearing Constitutional: Pt appears well-developed  and well-nourished.  HENT: Head: Normocephalic.  Right Ear: External ear normal.  Left Ear: External ear normal.  Eyes: Conjunctivae and EOM are normal. Pupils are equal, round, and reactive to light.  Neck: Normal range of motion. Neck supple.  Cardiovascular: Normal rate and regular rhythm.   Pulmonary/Chest: Effort normal and breath sounds normal.  Abd:  Soft, NT, non-distended, + BS Neurological: Pt is alert. Not confused , o/w not done in detail Skin: Skin is warm. No erythema.  Psychiatric: Pt behavior is normal. Thought content normal.     Assessment & Plan:

## 2012-05-05 ENCOUNTER — Encounter: Payer: Self-pay | Admitting: Internal Medicine

## 2012-05-10 ENCOUNTER — Ambulatory Visit (INDEPENDENT_AMBULATORY_CARE_PROVIDER_SITE_OTHER): Payer: Medicare Other | Admitting: Neurology

## 2012-05-10 ENCOUNTER — Encounter: Payer: Self-pay | Admitting: Neurology

## 2012-05-10 VITALS — BP 90/58 | HR 80 | Temp 97.6°F | Resp 18 | Ht 73.0 in | Wt 172.8 lb

## 2012-05-10 DIAGNOSIS — G231 Progressive supranuclear ophthalmoplegia [Steele-Richardson-Olszewski]: Secondary | ICD-10-CM

## 2012-05-10 DIAGNOSIS — G245 Blepharospasm: Secondary | ICD-10-CM

## 2012-05-10 DIAGNOSIS — G238 Other specified degenerative diseases of basal ganglia: Secondary | ICD-10-CM

## 2012-05-10 NOTE — Progress Notes (Signed)
Spencer Montoya was seen today in the movement disorders clinic for neurologic f/u.  The patient was previously seen by Dr. Modesto Charon but since he is no longer with the practice I am resuming the patient's care.  I reviewed Dr. Nash Dimmer prior notes.  This patient is accompanied in the office by his daughter Spencer Montoya) who supplements the history.    The consultation is for the evaluation of PSP.  The patient was diagnosed with progressive supranuclear palsy about 4 years ago in [place] after multiple falls.   He still has falls up to multiple times per day, and no falls on rare days.  His last significant fall was in May after hitting his head.  He had stitches in the head.  Last November he broke ribs after a fall.   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 Modesto Charon recommended that the patient not drive.  Unfortunately, the patient has not followed that recommendation.  The patient currently lives with his daughter who recently fell at home that was modified to make it handicap accessible.  His daughter does work outside the home.  There is no other help available, but the patient and his daughter do not think that he needs further help.  The patient is currently on carbidopa/levodopa CR 50/200, 2 tablets 3 times a day.   Specific Symptoms:  Tremor: no Voice: vocal cord paralysis, almost completely unable to phonate for 2 years Sleep: sleeps well  Vivid Dreams:  no  Acting out dreams:  no Wet Pillows: no...occasionally during the day Postural symptoms:  yes  Falls?  yes Bradykinesia symptoms: difficulty with initiating movement and reduced facial expressions...1 step into house.  Lives with  Daughter.  Just built house that was handicap accesible.   Loss of smell:  yes Loss of taste:  no Urinary Incontinence:  yes Difficulty Swallowing:  yes...chokes some on liquids but daughter states better than it was Handwriting, micrographia: yes Trouble with ADL's:  no  Trouble buttoning clothing:  no Depression:  no, and no pathologic laughter/crying Memory changes:  yes but minor issues Hallucinations:  no  visual distortions: no N/V:  no Lightheaded:  no  Syncope: no Diplopia:  no   PREVIOUS MEDICATIONS: Sinemet CR  ALLERGIES:  No Known Allergies  CURRENT MEDICATIONS:  Current Outpatient Prescriptions on File Prior to Visit  Medication Sig Dispense Refill  . aspirin 81 MG tablet Take 81 mg by mouth daily.        . beclomethasone (QVAR) 80 MCG/ACT inhaler Inhale 1 puff into the lungs 2 (two) times daily.      . bisacodyl (DULCOLAX) 5 MG EC tablet Take 5 mg by mouth as needed.      . carbidopa-levodopa (SINEMET CR) 50-200 MG per tablet Take 2 tablets by mouth 3 (three) times daily.      . naproxen sodium (ANAPROX) 220 MG tablet Take 440 mg by mouth daily as needed. For pain.      Marland Kitchen omeprazole (PRILOSEC) 20 MG capsule Take 20 mg by mouth daily.        Marland Kitchen QVAR 80 MCG/ACT inhaler INHALE 1 PUFF INTO THE LUNGS TWO (2) TIMES DAILY. RINSE MOUTH AFTEREACH USE.  1 Inhaler  PRN  . SEREVENT DISKUS 50 MCG/DOSE diskus inhaler INHALE ONE PUFF BY MOUTH TWICE DAILY  60 each  PRN  . SPIRIVA HANDIHALER 18 MCG inhalation capsule INHALE ONE CAPSULE BY MOUTH ONE TIME DAILY  30 each  PRN    PAST MEDICAL HISTORY:  Past Medical History  Diagnosis Date  . COPD (chronic obstructive pulmonary disease)     STAGE 2  . Arthritis   . Dysphonia   . Supranuclear palsy 01/20/2011    PAST SURGICAL HISTORY:   Past Surgical History  Procedure Date  . Tonsillectomy   . Cataract extraction     SOCIAL HISTORY:   History   Social History  . Marital Status: Widowed    Spouse Name: N/A    Number of Children: N/A  . Years of Education: N/A   Occupational History  . 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 Smoking status: Former Smoker -- 1.0 packs/day for 50 years    Quit date: 06/30/2006  . Smokeless tobacco: Never Used  . Alcohol Use: Yes     Comment: rare  . Drug Use: No   . Sexually Active: Not on file   Other Topics Concern  . Not on file   Social History Narrative  . No narrative on file    FAMILY HISTORY:   Family Status  Relation Status Death Age  . Mother Deceased     pneumonia  . Father Deceased     CVA  . Sister Alive     healthy  . Child Alive     2, alive and well    ROS:  A complete 10 system review of systems was obtained and was unremarkable apart from what is mentioned above.  PHYSICAL EXAMINATION:    VITALS:   Filed Vitals:   05/10/12 0813  BP: 90/58  Pulse: 80  Temp: 97.6 F (36.4 C)  Resp: 18  Height: 6\' 1"  (1.854 m)  Weight: 172 lb 12.8 oz (78.382 kg)    GEN:  The patient appears stated age and is in NAD. HEENT:  Normocephalic, atraumatic.  The mucous membranes are moist. The superficial temporal arteries are without ropiness or tenderness. CV:  RRR Lungs:  CTAB Neck/HEME:  There are no carotid bruits bilaterally.  Neurological examination:  Orientation: The patient is alert and oriented x3. Fund of knowledge is appropriate.  Recent and remote memory are intact.  Attention and concentration are normal.    Able to name objects and repeat phrases. Cranial nerves: There is good facial symmetry. There is facial hypomimia.  The eyelids are held open, but the minute that he is asked to close the minute he needs to blink, the eyelids remain closed and he will have to manually open them.  Pupils are equal round and reactive to light bilaterally.  There are square wave jerks.   Fundoscopic exam reveals clear margins bilaterally. Extraocular muscles are intact. The visual fields are full to confrontational testing. The speech is fluent but very hypophonic (whisper only). Soft palate rises symmetrically and there is no tongue deviation. Hearing is intact to conversational tone. Sensation: Sensation is intact to light and pinprick throughout (facial, trunk, extremities). Vibration is intact at the bilateral big toe. There is no  extinction with double simultaneous stimulation. There is no sensory dermatomal level identified. Motor: Strength is 5/5 in the bilateral upper and lower extremities.   Shoulder shrug is equal and symmetric.  There is no pronator drift. Deep tendon reflexes: Deep tendon reflexes are 2/4 at the bilateral biceps, triceps, brachioradialis, patella and achilles. Plantar responses are downgoing bilaterally.  Movement examination: Tone: There is just slightly increased tone in the left upper extremity.  Tone in the right upper extremity is normal.  Tone  in the lower extremities is normal.  Abnormal movements: None. Coordination:  There is minimally decreased decremation with RAM's, just noted with heel taps and toe taps bilaterally. Gait and Station: The patient has significant difficulty arising out of a deep-seated chair without the use of the hands.  He has significant retropulsion.  He has a stooped posture.  He is very unstable.    LABS:    Chemistry      Component Value Date/Time   NA 135 05/04/2012 1620   K 4.0 05/04/2012 1620   CL 103 05/04/2012 1620   CO2 24 05/04/2012 1620   BUN 21 05/04/2012 1620   CREATININE 1.2 05/04/2012 1620      Component Value Date/Time   CALCIUM 9.1 05/04/2012 1620   ALKPHOS 63 05/04/2012 1620   AST 15 05/04/2012 1620   ALT 4 05/04/2012 1620   BILITOT 0.7 05/04/2012 1620       Lab Results  Component Value Date   WBC 11.8* 05/04/2012   HGB 15.1 05/04/2012   HCT 46.1 05/04/2012   MCV 93.9 05/04/2012   PLT 264.0 05/04/2012   Lab Results  Component Value Date   TSH 1.68 05/04/2012   No results found for this basename: VITAMINB12     ASSESSMENT:   1.  Progressive supranuclear palsy.  I agree with the diagnosis.  The patient's biggest problems at this point in time are falls and eyelid opening apraxia/blepharospasm.  He is at significant risk for fracture and aspiration.  PLAN:   1.  I am going to schedule the patient for a modified barium swallow  evaluation. 2.  I talked to the patient and his daughter about safety.  Greater than 50% of the 80 minute visit was spent in counseling in this regard.  We talked about the fact that I don't think that he should be walking at all.  I think that he should be in a wheelchair at all times.  In addition, like Dr. Modesto Charon, I am concerned about his driving and ability to do that, especially with the inability to hold the eyes open.  They understand our position regarding driving. 3.  He will continue on carbidopa/levodopa 50/200 CR, 2 tablets 3 times a day. 4.  we are going to try to get him approved for Botox for the eyelids.  Hopefully, we will be up to do this in the next few weeks. 5.  Return in about 4 months (around 09/07/2012).

## 2012-05-10 NOTE — Patient Instructions (Addendum)
1.  Use wheelchair at all times 2.  We will try to get botox approval for the eyelids 3.  Call me if you need refills of medication  Your modified barium swallow is scheduled for Wednesday, November 13th at 2:00 pm at [facility]. Please check in first floor radiology department at 1:45 pm.  [phone]  We will left you know about the Botox appointment once it is authorized.

## 2012-05-12 ENCOUNTER — Emergency Department (HOSPITAL_COMMUNITY): Payer: Medicare Other

## 2012-05-12 ENCOUNTER — Emergency Department (HOSPITAL_COMMUNITY)
Admission: EM | Admit: 2012-05-12 | Discharge: 2012-05-12 | Disposition: A | Discharge status: Home / Self Care | Payer: Medicare Other | Attending: Emergency Medicine | Admitting: Emergency Medicine

## 2012-05-12 ENCOUNTER — Other Ambulatory Visit (HOSPITAL_COMMUNITY): Payer: Medicare Other

## 2012-05-12 ENCOUNTER — Ambulatory Visit (HOSPITAL_COMMUNITY): Admission: RE | Admit: 2012-05-12 | Payer: Medicare Other | Source: Ambulatory Visit

## 2012-05-12 ENCOUNTER — Encounter (HOSPITAL_COMMUNITY): Payer: Self-pay | Admitting: *Deleted

## 2012-05-12 DIAGNOSIS — Z043 Encounter for examination and observation following other accident: Secondary | ICD-10-CM | POA: Insufficient documentation

## 2012-05-12 DIAGNOSIS — Z7982 Long term (current) use of aspirin: Secondary | ICD-10-CM | POA: Insufficient documentation

## 2012-05-12 DIAGNOSIS — Z87891 Personal history of nicotine dependence: Secondary | ICD-10-CM | POA: Insufficient documentation

## 2012-05-12 DIAGNOSIS — J449 Chronic obstructive pulmonary disease, unspecified: Secondary | ICD-10-CM | POA: Insufficient documentation

## 2012-05-12 DIAGNOSIS — W19XXXA Unspecified fall, initial encounter: Secondary | ICD-10-CM

## 2012-05-12 DIAGNOSIS — J4489 Other specified chronic obstructive pulmonary disease: Secondary | ICD-10-CM | POA: Insufficient documentation

## 2012-05-12 DIAGNOSIS — Y9389 Activity, other specified: Secondary | ICD-10-CM | POA: Insufficient documentation

## 2012-05-12 DIAGNOSIS — R296 Repeated falls: Secondary | ICD-10-CM | POA: Insufficient documentation

## 2012-05-12 DIAGNOSIS — Z8739 Personal history of other diseases of the musculoskeletal system and connective tissue: Secondary | ICD-10-CM | POA: Insufficient documentation

## 2012-05-12 DIAGNOSIS — Z8669 Personal history of other diseases of the nervous system and sense organs: Secondary | ICD-10-CM | POA: Insufficient documentation

## 2012-05-12 DIAGNOSIS — Y929 Unspecified place or not applicable: Secondary | ICD-10-CM | POA: Insufficient documentation

## 2012-05-12 NOTE — Discharge Instructions (Signed)
Followup with your Dr. as needed °

## 2012-05-12 NOTE — Progress Notes (Signed)
[facility] ED CM consulted by ED RN for assistance with O2 for home d/c. RN reported pt came for barium swallow appointment without his oxygen that he states he uses at home CM spoke with Daughter, Javonne Dorko at [phone]. Daughter states she is aware her father had a barium swallow appointment today (she states he made appointment on a day she could not drive him) CM updated her that he did not complete the barium swallow and was brought to ED. CM inquired if [name] could bring pt's oxygen from home to ED for d/c to home. [name] states "He does not wear oxygen during the day. He is only required to wear it at night. He has COPD. He generally stays around 90-91% during the day." [name] states she is at work and can not bring pt's oxygen.  [name] reports pt will be "okay to drive home" and she will be calling him from her cell phone to make sure he made it home.  CM also informed [name] she would call pt to check to make sure he has arrived home Cm updated ED RN Noted O2 sats in ED 93-96% on 2 L O2 [place], was 91% without oxygen when first arrived to [facility] ED

## 2012-05-12 NOTE — ED Provider Notes (Signed)
History     CSN: [id number]  Arrival date & time 05/12/12  1339   First MD Initiated Contact with Patient 05/12/12 1505      Chief Complaint  Patient presents with  . Fall    (Consider location/radiation/quality/duration/timing/severity/associated sxs/prior treatment) Patient is a 76 y.o. male presenting with fall. The history is provided by the patient.  Fall  pt here after losing his balance with getting out of the car to use his walker, fell without loc or striking his head--denies and chest pain or new dyspena--h/o copd and chronically on o2 at nite--denies and pre-syncope sx--no recent illness--currently denies back or hip pain and states that he is at his baseline  Past Medical History  Diagnosis Date  . COPD (chronic obstructive pulmonary disease)     STAGE 2  . Arthritis   . Dysphonia   . Supranuclear palsy 01/20/2011    Past Surgical History  Procedure Date  . Tonsillectomy   . Cataract extraction     Family History  Problem Relation Age of Onset  . Diabetes    . Hypertension    . Cancer    . Stroke Other     History  Substance Use Topics  . Smoking status: Former Smoker -- 1.0 packs/day for 50 years    Quit date: 06/30/2006  . Smokeless tobacco: Never Used  . Alcohol Use: Yes     Comment: rare      Review of Systems  All other systems reviewed and are negative.    Allergies  Review of patient's allergies indicates no known allergies.  Home Medications   Current Outpatient Rx  Name  Route  Sig  Dispense  Refill  . ASPIRIN 81 MG PO TABS   Oral   Take 81 mg by mouth daily.           Marland Kitchen BISACODYL 5 MG PO TBEC   Oral   Take 5 mg by mouth as needed. For constipation.         Marland Kitchen CARBIDOPA-LEVODOPA ER 50-200 MG PO TBCR   Oral   Take 2 tablets by mouth 3 (three) times daily.         Marland Kitchen NAPROXEN SODIUM 220 MG PO TABS   Oral   Take 440 mg by mouth daily as needed. For pain.         Marland Kitchen QVAR 80 MCG/ACT IN AERS      INHALE 1 PUFF  INTO THE LUNGS TWO (2) TIMES DAILY. RINSE MOUTH AFTEREACH USE.   1 Inhaler   PRN   . SEREVENT DISKUS 50 MCG/DOSE IN AEPB      INHALE ONE PUFF BY MOUTH TWICE DAILY   60 each   PRN   . SPIRIVA HANDIHALER 18 MCG IN CAPS      INHALE ONE CAPSULE BY MOUTH ONE TIME DAILY   30 each   PRN     BP 136/67  Pulse 84  Temp 97.5 F (36.4 C) (Oral)  Resp 19  SpO2 95%  Physical Exam  Nursing note and vitals reviewed. Constitutional: He is oriented to person, place, and time. He appears well-developed and well-nourished.  Non-toxic appearance. No distress.  HENT:  Head: Normocephalic and atraumatic.  Eyes: Conjunctivae normal, EOM and lids are normal. Pupils are equal, round, and reactive to light.  Neck: Normal range of motion. Neck supple. No spinous process tenderness and no muscular tenderness present. No tracheal deviation present. No mass present.  Cardiovascular: Normal rate, regular rhythm  and normal heart sounds.  Exam reveals no gallop.   No murmur heard. Pulmonary/Chest: Effort normal and breath sounds normal. No stridor. No respiratory distress. He has no decreased breath sounds. He has no wheezes. He has no rhonchi. He has no rales.  Abdominal: Soft. Normal appearance and bowel sounds are normal. He exhibits no distension. There is no tenderness. There is no rebound and no CVA tenderness.  Musculoskeletal: Normal range of motion. He exhibits no edema and no tenderness.  Neurological: He is alert and oriented to person, place, and time. He has normal strength. No cranial nerve deficit or sensory deficit. GCS eye subscore is 4. GCS verbal subscore is 5. GCS motor subscore is 6.  Skin: Skin is warm and dry. No abrasion and no rash noted.  Psychiatric: He has a normal mood and affect. His speech is normal and behavior is normal.    ED Course  Procedures (including critical care time)  Labs Reviewed - No data to display Dg Chest 2 View  05/12/2012  *RADIOLOGY REPORT*  Clinical  Data: Shortness of breath  CHEST - 2 VIEW  Comparison: 11/22/2011  Findings: The cardiac shadow is stable.  The lungs remain hyperinflated.  Or old rib fractures are noted vertically on the right with healing.  No acute infiltrate or sizable effusion is noted.  IMPRESSION: COPD without acute abnormality.   Original Report Authenticated By: Alcide Clever, M.D.      No diagnosis found.    MDM  Patient's chest x-ray results noted and are consistent with COPD. Patient's was ambulated in the department and was not dyspneic. He is at his baseline at this time. And is requesting to go home        Toy Baker, MD 05/12/12 1544

## 2012-05-12 NOTE — ED Notes (Signed)
Pt brought from short stay d/t fall, per PT Garrel Ridgel) she was talking on the phone and saw pt fall, Pt denies LOC or hitting his head, denies any pain. When pt arrived was short of breath w/ wheezing, 91% on RA, placed on 2L [place]. Pt is alert/oriented x 3, states he was here for a Barium swallow study but did not receive it before the fall.

## 2012-05-12 NOTE — ED Notes (Signed)
Pt escorted to d/c window. Pt and family verbalized understanding d/c instructions. In no acute distress. Pts family has O2 tank in car if pt needs oxygen. Daughter reports pt only wears oxygen at home at night.

## 2012-05-20 ENCOUNTER — Ambulatory Visit (HOSPITAL_COMMUNITY)
Admission: RE | Admit: 2012-05-20 | Discharge: 2012-05-20 | Disposition: A | Discharge status: Home / Self Care | Payer: Medicare Other | Source: Ambulatory Visit | Attending: Neurology | Admitting: Neurology

## 2012-05-20 DIAGNOSIS — G238 Other specified degenerative diseases of basal ganglia: Secondary | ICD-10-CM | POA: Insufficient documentation

## 2012-05-20 DIAGNOSIS — G231 Progressive supranuclear ophthalmoplegia [Steele-Richardson-Olszewski]: Secondary | ICD-10-CM

## 2012-05-20 DIAGNOSIS — R1310 Dysphagia, unspecified: Secondary | ICD-10-CM

## 2012-05-20 DIAGNOSIS — G245 Blepharospasm: Secondary | ICD-10-CM

## 2012-05-20 DIAGNOSIS — R471 Dysarthria and anarthria: Secondary | ICD-10-CM | POA: Insufficient documentation

## 2012-05-20 NOTE — Procedures (Signed)
Objective Swallowing Evaluation: Modified Barium Swallowing Study  Patient Details  Name: Spencer Montoya MRN: [id number] Date of Birth: 02/02/1936  Today's Date: 05/20/2012 Time: 6213-0865 SLP Time Calculation (min): 42 min  Past Medical History:  Past Medical History  Diagnosis Date  . COPD (chronic obstructive pulmonary disease)     STAGE 2  . Arthritis   . Dysphonia   . Supranuclear palsy 01/20/2011   Past Surgical History:  Past Surgical History  Procedure Date  . Tonsillectomy   . Cataract extraction    HPI:  76 yo male referred for outpt MBS due to concerns pt may be aspirating.  PMH + for COPD Stage 2, arthritis, severe eyelid apraxia, vocal fold paresis and frequent falls due to progressive supranuclear palsy.  Pt used to smoke but quite approx five years ago.  Medication list includes carbidopa/levodopa and Prilosec.  Pt did acknowledge occasional choking on foods more than liquids at times via direct question cue, but does not present this as a significant issue.  Limited facial expressions noted and Spencer Montoya did not iniitate communication.  Daughter present for evaluation and reports she works and therefore pt is alone during the day.  [facility] has seen pt for vocal fold paresis but without improvement per pt.      Assessment / Plan / Recommendation Clinical Impression  Dysphagia Diagnosis: Mild oral phase dysphagia;Mild pharyngeal phase dysphagia  Clinical impression: Pt presents with mild oropharyngeal dysphagia without aspiration or penetration of any consistency tested.  Oral dysphagia characterized by decr coordination/organization resulting in oral residuals prematurely spilling into pharynx.  Sensory deficits noted in pharynx resulting in pt requiring verbal cues to dry swallow to clear residuals approx 75% of the time.  Pharyngeal swallow was strong with great clearance of pudding/cracker, but minimal residuals of liquids.    Unfortunately pt did not  cough throughout entire MBS,  thus advised pt to use caution with eating and conduct dry swallows - even when he senses pharynx is clear.  Due to pt's aphonia, his airway protection is compromised and this may impact him more as his PSP progresses.  Pt and daughter educated and both agreed to recommendations.  Thanks for this referral.     Treatment Recommendation       Diet Recommendation Regular;Thin liquid (modify diet if dyspenic or swallow worsens)   Liquid Administration via: Straw;Cup Medication Administration: Other (Comment) (as tolerated) Supervision: Patient able to self feed Compensations: Slow rate;Small sips/bites;Multiple dry swallows after each bite/sip Postural Changes and/or Swallow Maneuvers: Seated upright 90 degrees;Upright 30-60 min after meal    Other  Recommendations Oral Care Recommendations: Oral care before and after PO   Follow Up Recommendations  None    Frequency and Duration        Pertinent Vitals/Pain Afebrile, decr    SLP Swallow Goals     General Date of Onset: 05/20/12 HPI: 76 yo male referred for outpt MBS due to concerns pt may be aspirating.  PMH + for COPD Stage 2, arthritis, severe eyelid apraxia, vocal fold paresis and frequent falls due to progressive supranuclear palsy.  Pt used to smoke but quite approx five years ago.  Medication list includes carbidopa/levodopa and Prilosec.  Pt did acknowledge occasional choking on foods more than liquids at times via direct question cue, but does not present this as a significant issue.  Limited facial expressions noted and Spencer Montoya did not iniitate communication.  Daughter present for evaluation and reports she works and  therefore pt is alone during the day.  [facility] at [facility] has seen pt for vocal fold paresis but without improvement per pt.  Type of Study: Modified Barium Swallowing Study Reason for Referral: Objectively evaluate swallowing function Previous Swallow Assessment: Radiology  swallow evaluation 09/05/10 normal swallow reflex, no aspiration, all consistencies well tolerated Diet Prior to this Study: Regular;Thin liquids Temperature Spikes Noted: No Respiratory Status: Room air (pt uses oxygen at night - 2 liters) History of Recent Intubation: No Behavior/Cognition: Alert;Cooperative;Pleasant mood Oral Cavity - Dentition: Dentures, top (few missing lower teeth) Oral Motor / Sensory Function:  (mildly decr lingual mobility) Self-Feeding Abilities: Able to feed self Patient Positioning: Upright in chair Baseline Vocal Quality: Aphonic Volitional Cough: Weak Volitional Swallow: Able to elicit Anatomy: Within functional limits Pharyngeal Secretions: Not observed secondary MBS    Reason for Referral Objectively evaluate swallowing function   Oral Phase Oral Preparation/Oral Phase Oral Phase: Impaired Oral - Nectar Oral - Nectar Cup: Piecemeal swallowing;Lingual/palatal residue Oral - Thin Oral - Thin Cup: Piecemeal swallowing;Lingual/palatal residue Oral - Solids Oral - Puree: Delayed oral transit Oral - Regular: Delayed oral transit;Lingual/palatal residue Oral - Pill: Delayed oral transit (pt needed second bolus of liquid to aid transit) Oral Phase - Comment Oral Phase - Comment: premature spillage of oral residuals without reflexive swallow to clear or swallow was delayed   Pharyngeal Phase Pharyngeal Phase Pharyngeal Phase: Impaired Pharyngeal - Nectar Pharyngeal - Nectar Cup: Pharyngeal residue - valleculae;Pharyngeal residue - pyriform sinuses;Lateral channel residue;Premature spillage to valleculae (spilling to pyriform) Pharyngeal - Thin Pharyngeal - Thin Cup: Pharyngeal residue - valleculae;Pharyngeal residue - pyriform sinuses;Lateral channel residue Pharyngeal - Solids Pharyngeal - Puree: Within functional limits Pharyngeal - Regular: Within functional limits Pharyngeal - Pill: Within functional limits  Cervical Esophageal Phase    GO     Cervical Esophageal Phase Cervical Esophageal Phase: Impaired Cervical Esophageal Phase - Comment Cervical Esophageal Comment: barium tablet taken with liquid appeared to lodge at midesophagus without pt awareness, further sips of transited tablet into esophagus, radiologist not present to confirm    Functional Assessment Tool Used: MBS clinical judgement Functional Limitations: Swallowing Swallow Current Status (O1308): At least 1 percent but less than 20 percent impaired, limited or restricted Swallow Goal Status [phone]): At least 1 percent but less than 20 percent impaired, limited or restricted Swallow Discharge Status [phone]): At least 1 percent but less than 20 percent impaired, limited or restricted    Donavan Burnet, MS [facility] SLP [phone]

## 2012-06-02 ENCOUNTER — Encounter: Payer: Self-pay | Admitting: Internal Medicine

## 2012-06-10 ENCOUNTER — Ambulatory Visit (INDEPENDENT_AMBULATORY_CARE_PROVIDER_SITE_OTHER): Payer: Medicare Other | Admitting: Neurology

## 2012-06-10 ENCOUNTER — Encounter: Payer: Self-pay | Admitting: Neurology

## 2012-06-10 VITALS — BP 90/50 | HR 78 | Temp 97.4°F | Resp 18 | Wt 170.0 lb

## 2012-06-10 DIAGNOSIS — G245 Blepharospasm: Secondary | ICD-10-CM

## 2012-06-10 MED ORDER — ONABOTULINUMTOXINA 100 UNITS IJ SOLR
100.0000 [IU] | Freq: Once | INTRAMUSCULAR | Status: AC
  Administered 2012-06-10: 100 [IU] via INTRAMUSCULAR

## 2012-06-10 NOTE — Progress Notes (Signed)
[facility]   Procedure Note Botox  Attending: Dr. Lurena Joiner Josehua Hammar  Preoperative Diagnosis(es): Blepharospasm     Consent obtained from: The patient Benefits discussed included, but were not limited to ability to more easily open eyes and therefore see better.  Risk discussed included, but were not limited pain and discomfort, bleeding, bruising, excessive weakness, venous thrombosis, muscle atrophy and drooping of eyelids.  A copy of the patient medication guide was given to the patient which explains the blackbox warning.  Patients identity and treatment sites confirmed yes.  Details of Procedure: Skin was cleaned with alcohol.  A 30 gauge, 1/2 inch needle was introduced to the target muscle.  Prior to injection, the needle plunger was aspirated to make sure the needle was not within a blood vessel.  There was no blood retrieved on aspiration.    Following is a summary of the muscles injected  And the amount of Botulinum toxin used:   Dilution 0.9% preservative free saline mixed with 100 u Botox type A to make 5 U per 0.1cc (2 cc of saline used per 100 U botox)  Injections  Location Left  Right Units  Upper lateral orbicularis oculi   2.5   2.5   5   Upper medial orbicularis oculi   2.5   2.5   5   Lateral orbicularis oculi   5   5   10    Lower lateral orbicularis oculi   2.5   2.5   5   Lower medial orbicularis oculi  2.5   2.5   5             TOTAL UNITS:   30 Units   Agent: Botulinum Type A ( Onobotulinum Toxin type A ).  1 vials of Botox were used, each diluted with 2 cc of preservative free saline (50 units per 1 cc)   Total injected (Units): 30 units  Total wasted (Units): 70   Pt tolerated procedure well without complications.   Reinjection is anticipated in 3 months.

## 2012-06-25 ENCOUNTER — Ambulatory Visit (INDEPENDENT_AMBULATORY_CARE_PROVIDER_SITE_OTHER): Payer: Medicare Other | Admitting: Internal Medicine

## 2012-06-25 ENCOUNTER — Encounter: Payer: Self-pay | Admitting: Internal Medicine

## 2012-06-25 VITALS — BP 116/72 | HR 71 | Ht 73.0 in | Wt 172.6 lb

## 2012-06-25 DIAGNOSIS — J449 Chronic obstructive pulmonary disease, unspecified: Secondary | ICD-10-CM

## 2012-06-25 MED ORDER — METHYLPREDNISOLONE ACETATE 80 MG/ML IJ SUSP
80.0000 mg | Freq: Once | INTRAMUSCULAR | Status: AC
  Administered 2012-06-25: 80 mg via INTRAMUSCULAR

## 2012-06-25 MED ORDER — LEVALBUTEROL HCL 0.63 MG/3ML IN NEBU
0.6300 mg | INHALATION_SOLUTION | Freq: Once | RESPIRATORY_TRACT | Status: AC
  Administered 2012-06-25: 0.63 mg via RESPIRATORY_TRACT

## 2012-06-25 NOTE — Patient Instructions (Addendum)
Order- DME - change O2 to 2 L/M continuous and portable 2L/M    Resting O2 sat today on room air 86%  Neb xop 0.63  Depo 80

## 2012-06-25 NOTE — Progress Notes (Signed)
Patient ID: Spencer Montoya, male    DOB: 1936-06-09, 76 y.o.   MRN: [id number]  HPI 01/20/11-  10 yoM former smoker, here with daughter,  seen for pulmonary evaluation at request of daughter who was former patient here.  50 Pack year smoking hx, QS x 5 years. Dx'd COPD stage 2, about 4-5 years ago while living in [place]. He moved to [place] 1 year ago, and moves here now to live with daughter. Widower, retired from [employer]..  Home O2 used for sleep x 2 years, but this was discontinued on move from [place], off x 1 week, needing to get re-established.  Describes little day to day change in breathing- probably worse in past year. . DOE around the house, but doesn't wake him. Does wheeze,  But controlled with Qvar 80, Serevent, Spiriva. Coughs only swallowing liquids. Swallowing eval reported negative- 09/05/10. Marland Kitchen  Has Supranuclear Palsy "form of Parkinson's", with no recent neurology evaluation. Aphonia  from this is almost total x 2 years- faintly whispers. Had Speech therapy [employer] [place] [facility] - uses walker. Denies heart disease Pneumonia x 1, 3-4 years ago. Had pneumovax. Gets flu vax. PFT 03/27/10- FEV1 2.15/ 68%; FEV1/FVC 0.77, No response to BD; TLC 115%; RV/ TLC 132%;  CXR- 03/05/10- NAD  02/20/11- 46 year old former smoker followed for COPD, complicated by Supranuclear palsy/ vocal paresis After last visit, initial PCP visit is pending 02/28/11- he asks I refill carbidopa-levo ER 50-200, 1 TID, pending establishing a PCP. Then we expect PCP will refer to a Neurologist to establish.   . Home O2 started and helps, but he came today without it and resting room air sat 92% is better than 87% last time.  Now using mostly for sleep.  CXR 01/20/11- COPD, NAD.   06/26/11-  37 year old former smoker followed for COPD, complicated by Supranuclear palsy/ vocal paresis.  Daughter here Had flu vaccine. Daughter comments that his voice remains in audible except when he has a  cold, at which time voice gets huskier and he is much easier to understand. Since starting home oxygen he has been sleeping much better. They deny choking with food or drink. Chest x-ray 06/06/2011 was done when he fell and broke ribs.-Stable right posterior/ lateral rib fractures, emphysema.  12/25/11- 82 year old former smoker followed for COPD, complicated by Supranuclear palsy/ vocal paresis.   His voice has gotten no better after evaluation by Dr. Terri Skains and by the voice clinic at [facility]. Easy dyspnea on exertion is no worse, noticed mainly crossing street briskly or climbing stairs. He denies cough or wheeze. Breathing is not waking. He admits choking a little some times swallowing, which has been my biggest concern with his voice problem, but he minimizes it and indicates he is careful.   COPD assessment test (CAT) score 16/40 CXR 11/22/11- reviewed IMPRESSION:  1. Emphysema.  2. Bilateral lower rib deformities compatible with old fractures.  3. Atherosclerosis.  Original Report Authenticated By: Dellia Cloud, M.D.    06/25/12- 78 year old former smoker followed for COPD, complicated by Supranuclear palsy/ vocal paresis.  Daughter here 6 month follow up.  c/o increased DOE over the past month.  O2 sat 86% on RA upon arrival to exam room.  Also, reports wheezing and prod cough.  No chest tightness, chest pain, or f/c/s.   Had fallen and hit chest on his walker leaving a bruise which is now resolving. Increased wheeze but not acutely ill. Oxygen 2 L for sleep/? Am Home Pt.  CXR 11/13 /13 IMPRESSION:  COPD without acute abnormality.  Original Report Authenticated By: Alcide Clever, M.D.   Review of Systems-see HPI Constitutional:   No-   weight loss, night sweats, fevers, chills, fatigue, lassitude. HEENT:   No-   headaches, difficulty swallowing, tooth/dental problems, sore throat,                  No-   sneezing, itching, ear ache, nasal congestion, post nasal drip,  CV:   No-   chest pain, orthopnea, PND, swelling in lower extremities, anasarca, dizziness, palpitations GI:  No-   heartburn, indigestion, abdominal pain, nausea, vomiting, diarrhea,                 change in bowel habits, loss of appetite Resp:  No-  excess mucus, + dyspnea with exertion              No-  coughing up of blood.              No-   change in color of mucus.   Skin: No-   rash or lesions. GU:  MS:  No-   joint pain or swelling.  Psych:  No- change in mood or affect. No depression or anxiety.  No memory loss.  Objective:   Physical Exam BP 116/72  Pulse 71  Ht 6\' 1"  (1.854 m)  Wt 172 lb 9.6 oz (78.291 kg)  BMI 22.77 kg/m2  SpO2 91% General- Alert, Oriented, Affect-appropriate, Distress- none acute;  Medium build; rolling walker. Room Air Skin- rash-none, lesions- none, excoriation- none Lymphadenopathy- none Head- atraumatic            Eyes- Gross vision intact, PERRLA, conjunctivae clear secretions            Ears- Hearing, canals-normal            Nose- Clear, no-Septal dev, mucus, polyps, erosion, perforation             Throat- Mallampati II , mucosa clear , drainage- none, tonsils- atrophic   +Almost aphonic- faint whisper few words barely audible- now worse than last visit. .  Neck- flexible , trachea midline, no stridor , thyroid nl, carotid no bruit Chest - symmetrical excursion , unlabored           Heart/CV- RRR , no murmur , no gallop  , no rub, nl s1 s2                           - JVD- none , edema- none, stasis changes- none, varices- none           Lung- + few crackles, distant, unlabored, wheeze- none, cough- none , dullness-none, rub- none                          Chest wall-  Abd-  Br/ Gen/ Rectal- Not done, not indicated Extrem- cyanosis- none, clubbing, none, atrophy- none, strength- nl Neuro- uses a walker. Makes no effort to speak today.

## 2012-07-08 ENCOUNTER — Encounter: Payer: Self-pay | Admitting: *Deleted

## 2012-07-08 NOTE — Progress Notes (Signed)
Patient ID: Spencer Montoya, male   DOB: 1935-10-08, 77 y.o.   MRN: [id number] Pt arrived for PV today to get instructions for recall colonoscopy scheduled for 1/22. Pt has COPD and is using oxygen continuously.   He also has suprnuclear palsy and vocal cord paresis.  He is walking with a walker and has numberous abrasions on his arms from frequent falls.  Daughter is with pt.  I cancelled colonoscopy and PV and scheduled pt for new patient appointment with Dr. Rhea Belton to evaluate pt before scheduling colonoscopy

## 2012-07-09 NOTE — Assessment & Plan Note (Signed)
I don't hear stridor but I can barely understand the few words he tries to say Distant breath sounds with a few crackles represent unchanged exam. He feels somewhat more short of breath with exertion in a nonspecific pattern. Plan-treat as progression of COPD-nebulizer treatment, Depo-Medrol.DME/ advanced change oxygen to continuous and portable based on his arrival today with initial resting room air saturation 86%.

## 2012-07-15 ENCOUNTER — Telehealth: Payer: Self-pay | Admitting: Internal Medicine

## 2012-07-15 NOTE — Telephone Encounter (Signed)
Called and spoke with carol--pts daughter and she is aware of appt with CY in the morning at 9:30.  [name] will not be able to come with the pt but he will be here to be seen.  Nothing further is needed.

## 2012-07-15 NOTE — Telephone Encounter (Signed)
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 Pt c/o cough w/ yellow phlem, wheezing, chest congestion x yesterday. No chest tx or increase SOB. Please advise Dr. Maple Hudson thanks  Last OV 06/25/12 Pending 12/14/12 No Known Allergies

## 2012-07-16 ENCOUNTER — Ambulatory Visit (INDEPENDENT_AMBULATORY_CARE_PROVIDER_SITE_OTHER): Payer: Medicare Other | Admitting: Internal Medicine

## 2012-07-16 ENCOUNTER — Encounter: Payer: Self-pay | Admitting: Internal Medicine

## 2012-07-16 VITALS — BP 90/60 | HR 96 | Ht 73.0 in | Wt 170.2 lb

## 2012-07-16 DIAGNOSIS — J449 Chronic obstructive pulmonary disease, unspecified: Secondary | ICD-10-CM

## 2012-07-16 MED ORDER — METHYLPREDNISOLONE ACETATE 80 MG/ML IJ SUSP
80.0000 mg | Freq: Once | INTRAMUSCULAR | Status: AC
  Administered 2012-07-16: 80 mg via INTRAMUSCULAR

## 2012-07-16 MED ORDER — LEVALBUTEROL HCL 0.63 MG/3ML IN NEBU
0.6300 mg | INHALATION_SOLUTION | Freq: Once | RESPIRATORY_TRACT | Status: AC
  Administered 2012-07-16: 0.63 mg via RESPIRATORY_TRACT

## 2012-07-16 MED ORDER — DOXYCYCLINE HYCLATE 100 MG PO TABS: ORAL_TABLET | ORAL | Status: DC

## 2012-07-16 NOTE — Progress Notes (Signed)
Patient ID: Spencer Montoya, male    DOB: 1935-09-24, 77 y.o.   MRN: [id number]  HPI 01/20/11-  65 yoM former smoker, here with daughter,  seen for pulmonary evaluation at request of daughter who was former patient here.  50 Pack year smoking hx, QS x 5 years. Dx'd COPD stage 2, about 4-5 years ago while living in [place]. He moved to [place] 1 year ago, and moves here now to live with daughter. Widower, retired from [employer]..  Home O2 used for sleep x 2 years, but this was discontinued on move from [place], off x 1 week, needing to get re-established.  Describes little day to day change in breathing- probably worse in past year. . DOE around the house, but doesn't wake him. Does wheeze,  But controlled with Qvar 80, Serevent, Spiriva. Coughs only swallowing liquids. Swallowing eval reported negative- 09/05/10. Marland Kitchen  Has Supranuclear Palsy "form of Parkinson's", with no recent neurology evaluation. Aphonia  from this is almost total x 2 years- faintly whispers. Had Speech therapy [employer] [place] [employer] [facility] - uses walker. Denies heart disease Pneumonia x 1, 3-4 years ago. Had pneumovax. Gets flu vax. PFT 03/27/10- FEV1 2.15/ 68%; FEV1/FVC 0.77, No response to BD; TLC 115%; RV/ TLC 132%;  CXR- 03/05/10- NAD  02/20/11- 35 year old former smoker followed for COPD, complicated by Supranuclear palsy/ vocal paresis After last visit, initial PCP visit is pending 02/28/11- he asks I refill carbidopa-levo ER 50-200, 1 TID, pending establishing a PCP. Then we expect PCP will refer to a Neurologist to establish.   . Home O2 started and helps, but he came today without it and resting room air sat 92% is better than 87% last time.  Now using mostly for sleep.  CXR 01/20/11- COPD, NAD.   06/26/11-  4 year old former smoker followed for COPD, complicated by Supranuclear palsy/ vocal paresis.  Daughter here Had flu vaccine. Daughter comments that his voice remains in audible except when he has a  cold, at which time voice gets huskier and he is much easier to understand. Since starting home oxygen he has been sleeping much better. They deny choking with food or drink. Chest x-ray 06/06/2011 was done when he fell and broke ribs.-Stable right posterior/ lateral rib fractures, emphysema.  12/25/11- 38 year old former smoker followed for COPD, complicated by Supranuclear palsy/ vocal paresis.   His voice has gotten no better after evaluation by Dr. Terri Skains and by the voice clinic at [facility]. Easy dyspnea on exertion is no worse, noticed mainly crossing street briskly or climbing stairs. He denies cough or wheeze. Breathing is not waking. He admits choking a little some times swallowing, which has been my biggest concern with his voice problem, but he minimizes it and indicates he is careful.   COPD assessment test (CAT) score 16/40 CXR 11/22/11- reviewed IMPRESSION:  1. Emphysema.  2. Bilateral lower rib deformities compatible with old fractures.  3. Atherosclerosis.  Original Report Authenticated By: Dellia Cloud, M.D.    06/25/12- 73 year old former smoker followed for COPD, complicated by Supranuclear palsy/ vocal paresis.  Daughter here 6 month follow up.  c/o increased DOE over the past month.  O2 sat 86% on RA upon arrival to exam room.  Also, reports wheezing and prod cough.  No chest tightness, chest pain, or f/c/s.   Had fallen and hit chest on his walker leaving a bruise which is now resolving. Increased wheeze but not acutely ill. Oxygen 2 L for sleep/? Am Home Pt.  CXR 11/13 /13 IMPRESSION:  COPD without acute abnormality.  Original Report Authenticated By: Alcide Clever, M.D.   07/16/12- 75 year old former smoker followed for COPD, complicated by Supranuclear palsy/ vocal paresis.   ACUTE VISIT: wheezing, coughing, increased mucus. He remains unable to speak but brings a note from his daughter who has been treated for a sinus infection to which the patient has been  exposed. He indicates wheeze, cough and increased mucus but he denies sinus pain, pressure or postnasal drip. He continues oxygen at 2 L/ AM Home Pt DME?  Review of Systems-see HPI Constitutional:   No-   weight loss, night sweats, fevers, chills, fatigue, lassitude. HEENT:   No-   headaches, difficulty swallowing, tooth/dental problems, sore throat,                  No-   sneezing, itching, ear ache, nasal congestion, post nasal drip,  CV:  No-   chest pain, orthopnea, PND, swelling in lower extremities, anasarca, dizziness, palpitations GI:  No-   heartburn, indigestion, abdominal pain, nausea, vomiting, diarrhea,                 change in bowel habits, loss of appetite Resp:  No-  excess mucus, + dyspnea with exertion              No-  coughing up of blood.              No-   change in color of mucus.   Skin: No-   rash or lesions. GU:  MS:  No-   joint pain or swelling.  Psych:  No- change in mood or affect. No depression or anxiety.  No memory loss.  Objective:   Physical Exam BP 90/60  Pulse 96  Ht 6\' 1"  (1.854 m)  Wt 170 lb 3.2 oz (77.202 kg)  BMI 22.46 kg/m2  SpO2 91% General- Alert, Oriented, Affect-appropriate, Distress- none acute;  Medium build; rolling walker. O2 2L  + Aphonic- he makes no effort now to talk Skin- + scabs and scars on his forearms Lymphadenopathy- none Head- atraumatic            Eyes- Gross vision intact, PERRLA, conjunctivae clear secretions            Ears- Hearing, canals-normal            Nose- Clear, no-Septal dev, mucus, polyps, erosion, perforation             Throat- Mallampati II , mucosa clear , drainage- none, tonsils- atrophic Neck- flexible , trachea midline, no stridor , thyroid nl, carotid no bruit Chest - symmetrical excursion , unlabored           Heart/CV- RRR , no murmur , no gallop  , no rub, nl s1 s2                           - JVD- none , edema- none, stasis changes- none, varices- none           Lung- + few crackles, distant,  unlabored, wheeze- none, cough- none , dullness-none, rub- none                          Chest wall-  Abd-  Br/ Gen/ Rectal- Not done, not indicated Extrem- cyanosis- none, clubbing, none, atrophy- none, strength- nl Neuro- uses a walker. Makes no effort to speak today.

## 2012-07-16 NOTE — Patient Instructions (Addendum)
Neb xop 0.63  Depo 80  Script sent for doxycycline antibiotic   To [facility]/ [address]  Enough fluids, rest, avoid chilling   Please call as needed

## 2012-07-20 ENCOUNTER — Telehealth: Payer: Self-pay | Admitting: Internal Medicine

## 2012-07-20 MED ORDER — ALBUTEROL SULFATE (2.5 MG/3ML) 0.083% IN NEBU: 2.5000 mg | INHALATION_SOLUTION | Freq: Four times a day (QID) | RESPIRATORY_TRACT | Status: DC | PRN

## 2012-07-20 MED ORDER — IPRATROPIUM BROMIDE 0.02 % IN SOLN: 500.0000 ug | Freq: Four times a day (QID) | RESPIRATORY_TRACT | Status: DC | PRN

## 2012-07-20 NOTE — Telephone Encounter (Signed)
I spoke with Selena Batten and she stated pt was on Duoneb years ago and has a neb machine at home. She is wanting to know if pt cna go back on this. This seems to help him. Please advise Dr. Maple Hudson thanks Last OV 07/16/12 Pending OV 12/21/12

## 2012-07-20 NOTE — Telephone Encounter (Signed)
Per CY-okay to Rx through DME company Duoneb #50 1 qid prn with prn refills

## 2012-07-20 NOTE — Telephone Encounter (Signed)
LMTCB x 1 for [facility]. Pt has MCR and nebs will be easier to get from either [facility] or DME. Need to find out which way they want to do this.  DX:  496 File MCR Part B

## 2012-07-20 NOTE — Telephone Encounter (Signed)
Pt's daughter okay if RX is sent to [facility] for nebulizer medications. RX printed and faxed to [phone].Will need to send RX for generic albuterol and generic ipratropium.

## 2012-07-20 NOTE — Telephone Encounter (Signed)
Pt's daughter states that pt prefers to go through [facility] on [address].  Would like to know if it is possible to get this called in closer to home at [facility] in [place].  Would like to avoid having to come into [place] if at all possible.  Spencer Montoya

## 2012-07-21 ENCOUNTER — Encounter: Payer: Medicare Other | Admitting: Internal Medicine

## 2012-07-27 ENCOUNTER — Telehealth: Payer: Self-pay | Admitting: Neurology

## 2012-07-27 NOTE — Telephone Encounter (Signed)
That makes me really, really happy!  Thank you!

## 2012-07-27 NOTE — Telephone Encounter (Signed)
Picked up a call from the patient's daughter, [name]. She was calling to give Dr. Arbutus Leas an update on his response to the Botox injections he received on 06/10/12. She states he did very well and was able to keep his eyes open for the first time in years! She reports they were both pleased with the results. It is starting to wear off now and she states he does wish to have it again. I told her that it could be given every three months and his next injection would be in early March. I told her I would let Dr. Arbutus Leas know how pleased they were. She is fine with this plan. **Dr. Arbutus Leas.....FYI.

## 2012-07-30 NOTE — Assessment & Plan Note (Signed)
Exacerbation with wheeze and cough complicating a recent viral upper respiratory infection with bronchitis syndrome Plan-nebulizer treatment with Xopenex, Depo-Medrol, doxycycline, fluids

## 2012-08-02 ENCOUNTER — Encounter: Payer: Self-pay | Admitting: Internal Medicine

## 2012-08-04 ENCOUNTER — Encounter: Payer: Self-pay | Admitting: Internal Medicine

## 2012-08-04 ENCOUNTER — Ambulatory Visit (INDEPENDENT_AMBULATORY_CARE_PROVIDER_SITE_OTHER): Payer: Medicare Other | Admitting: Internal Medicine

## 2012-08-04 VITALS — BP 100/60 | HR 85 | Ht 73.0 in | Wt 166.0 lb

## 2012-08-04 DIAGNOSIS — Z86010 Personal history of colonic polyps: Secondary | ICD-10-CM

## 2012-08-04 DIAGNOSIS — Z1211 Encounter for screening for malignant neoplasm of colon: Secondary | ICD-10-CM

## 2012-08-04 DIAGNOSIS — J449 Chronic obstructive pulmonary disease, unspecified: Secondary | ICD-10-CM

## 2012-08-04 MED ORDER — PEG-KCL-NACL-NASULF-NA ASC-C 100 G PO SOLR: 1.0000 | Freq: Once | ORAL | Status: DC

## 2012-08-04 NOTE — Patient Instructions (Addendum)
You have been scheduled for a colonoscopy with propofol. Please follow written instructions given to you at your visit today.  Please pick up your prep kit at the pharmacy within the next 1-3 days. If you use inhalers (even only as needed) or a CPAP machine, please bring them with you on the day of your procedure. CC:  Oliver Barre MD

## 2012-08-04 NOTE — Progress Notes (Signed)
Patient ID: Spencer Montoya, male   DOB: 14-Jan-1936, 77 y.o.   MRN: [id number]  SUBJECTIVE: HPI Mr. Shelp is a 77 yo male with PMH of COPD, now on home oxygen, supranuclear palsy, and colon polyps who seen in consultation at the request of Dr. Jonny Ruiz for consideration of surveillance colonoscopy. He is accompanied today by his granddaughter with whom he lives.  The patient reports he had a screening colonoscopy performed 6 or 7 years ago in [place].  He was told to repeat the colonoscopy in 5 years, and recalls one polyp removed. This report is unavailable today. The patient and discuss repeat surveillance colonoscopy with Dr. Jonny Ruiz, and desires to have this test performed.  He was sent direct procedure, but was scheduled for an office visit it was discovered he was on continuous home option. The patient reports his lung disease is stable and he is not require rescue inhalers. It sounds that in January he had a URI which resolved with nebulizer treatments at home. He denies shortness of breath or chest pain today. He denies abdominal pain, change in bowel habits, diarrhea or constipation. No blood in his stool or melena. He does have a history of dysphasia which is felt secondary to his supranuclear palsy. The supranuclear palsy has also affected his speech and his gait. His granddaughter reports falling on a somewhat frequent basis at home.  Review of Systems  As per history of present illness, otherwise negative   Past Medical History  Diagnosis Date  . COPD (chronic obstructive pulmonary disease)     STAGE 2  . Arthritis   . Dysphonia   . Supranuclear palsy 01/20/2011  . Colon polyp     Colonoscopy-[facility]     Current Outpatient Prescriptions  Medication Sig Dispense Refill  . aspirin 81 MG tablet Take 81 mg by mouth daily.        . bisacodyl (DULCOLAX) 5 MG EC tablet Take 5 mg by mouth as needed. For constipation.      . carbidopa-levodopa (SINEMET CR) 50-200 MG per tablet  Take 2 tablets by mouth 3 (three) times daily.      Marland Kitchen QVAR 80 MCG/ACT inhaler INHALE 1 PUFF INTO THE LUNGS TWO (2) TIMES DAILY. RINSE MOUTH AFTEREACH USE.  1 Inhaler  PRN  . SEREVENT DISKUS 50 MCG/DOSE diskus inhaler INHALE ONE PUFF BY MOUTH TWICE DAILY  60 each  PRN  . SPIRIVA HANDIHALER 18 MCG inhalation capsule INHALE ONE CAPSULE BY MOUTH ONE TIME DAILY  30 each  PRN  . peg 3350 powder (MOVIPREP) 100 G SOLR Take 1 kit (100 g total) by mouth once.  1 kit  0    No Known Allergies  Family History  Problem Relation Age of Onset  . Diabetes    . Hypertension    . Stroke Other     History  Substance Use Topics  . Smoking status: Former Smoker -- 1.0 packs/day for 50 years    Quit date: 06/30/2006  . Smokeless tobacco: Never Used  . Alcohol Use: Yes     Comment: rare    OBJECTIVE: BP 100/60  Pulse 85  Ht 6\' 1"  (1.854 m)  Wt 166 lb (75.297 kg)  BMI 21.90 kg/m2 Constitutional: Chronically ill-appearing male wearing oxygen in no acute distress HEENT: Normocephalic and atraumatic. Oropharynx is clear and moist. No oropharyngeal exudate. Conjunctivae are normal. No scleral icterus. Neck: Neck supple. Trachea midline. Cardiovascular: Normal rate, regular rhythm and intact distal pulses. 2/6 systolic ejection  murmur Pulmonary/chest: Somewhat distant without wheezing rales or rhonchi Abdominal: Soft, nontender, nondistended. Bowel sounds active throughout.  Extremities: no clubbing, cyanosis, or edema Skin: Skin is warm and dry. Multiple skin abrasions bilateral upper extremities Psychiatric: Flat affect  Labs and Imaging -- CBC    Component Value Date/Time   WBC 11.8* 05/04/2012 1620   RBC 4.91 05/04/2012 1620   HGB 15.1 05/04/2012 1620   HCT 46.1 05/04/2012 1620   PLT 264.0 05/04/2012 1620   MCV 93.9 05/04/2012 1620   MCHC 32.8 05/04/2012 1620   RDW 14.4 05/04/2012 1620   LYMPHSABS 2.1 05/04/2012 1620   MONOABS 1.2* 05/04/2012 1620   EOSABS 0.2 05/04/2012 1620   BASOSABS 0.0  05/04/2012 1620    CMP     Component Value Date/Time   NA 135 05/04/2012 1620   K 4.0 05/04/2012 1620   CL 103 05/04/2012 1620   CO2 24 05/04/2012 1620   GLUCOSE 98 05/04/2012 1620   BUN 21 05/04/2012 1620   CREATININE 1.2 05/04/2012 1620   CALCIUM 9.1 05/04/2012 1620   PROT 6.9 05/04/2012 1620   ALBUMIN 4.2 05/04/2012 1620   AST 15 05/04/2012 1620   ALT 4 05/04/2012 1620   ALKPHOS 63 05/04/2012 1620   BILITOT 0.7 05/04/2012 1620    ASSESSMENT AND PLAN: 77 yo male with PMH of COPD, now on home oxygen, supranuclear palsy, and colon polyps who seen in consultation at the request of Dr. Jonny Ruiz for consideration of surveillance colonoscopy.  1.  CRC screening/polyp surveillance -- I would like to obtain the records from his first colonoscopy in [place] to ensure that the polyp removed was in the adenomatous, and that he is in fact due for repeat colonoscopy.  The patient desires to have this done, and we have discussed his comorbidities today and other options for colon cancer screening such as FOBT or barium enema. After a discussion, he desires to have a screening colonoscopy. I discussed this is a higher than baseline risk test given his COPD and home oxygen use, and I would like to this test be performed as an outpatient in hospital setting with anesthesia support.  He also discussed his dysphasia, and both he and his family feel that he will be oh tolerate and drink the prep in time a fashion.  I've also made him aware that his lung disease he is to be stable and at baseline at the time of his colonoscopy, otherwise the test would need to be canceled. We will schedule him in mid April when his granddaughter has a break from work.  If we receive records that indicate he is not due for repeat colonoscopy at this time, we will cancel the procedure.

## 2012-08-31 ENCOUNTER — Telehealth: Payer: Self-pay | Admitting: Gastroenterology

## 2012-08-31 NOTE — Telephone Encounter (Signed)
Faxed records request to Dr. Duke Salvia in [facility] for the 2nd time. Phone: [phone]  Fax: [phone]

## 2012-09-06 ENCOUNTER — Telehealth: Payer: Self-pay | Admitting: Gastroenterology

## 2012-09-06 NOTE — Telephone Encounter (Signed)
Requested prior colonoscopy records again.

## 2012-09-16 ENCOUNTER — Encounter: Payer: Self-pay | Admitting: Neurology

## 2012-09-16 ENCOUNTER — Ambulatory Visit (INDEPENDENT_AMBULATORY_CARE_PROVIDER_SITE_OTHER): Payer: Medicare Other | Admitting: Neurology

## 2012-09-16 VITALS — BP 102/60 | HR 88 | Temp 97.5°F | Resp 20 | Wt 166.0 lb

## 2012-09-16 DIAGNOSIS — G245 Blepharospasm: Secondary | ICD-10-CM

## 2012-09-16 MED ORDER — ONABOTULINUMTOXINA 100 UNITS IJ SOLR
100.0000 [IU] | Freq: Once | INTRAMUSCULAR | Status: AC
  Administered 2012-09-16: 100 [IU] via INTRAMUSCULAR

## 2012-09-16 NOTE — Progress Notes (Signed)
[facility]   Procedure Note Botox  Attending: Dr. Lurena Joiner Nyaire Denbleyker  Preoperative Diagnosis(es): Blepharospasm   Clinical history:  The patient did great with his last botox.  For the first time in years, he had sustained opening of the lids.  He noted some wearing off starting in late January.  It has not yet completely worn off (not even close to baseline per daughter) but it has worn off enough that he has to pull the lids open 2-3 times per day now.  There were no SE with last injection.   Consent obtained from: The patient  Benefits discussed included, but were not limited to ability to more easily open eyes and therefore see better.  Risk discussed included, but were not limited pain and discomfort, bleeding, bruising, excessive weakness, venous thrombosis, muscle atrophy and drooping of eyelids.  A copy of the patient medication guide was given to the patient which explains the blackbox warning.  Patients identity and treatment sites confirmed yes.  Details of Procedure: Skin was cleaned with alcohol.  A 30 gauge, 1/2 inch needle was introduced to the target muscle.  Prior to injection, the needle plunger was aspirated to make sure the needle was not within a blood vessel.  There was no blood retrieved on aspiration.    Following is a summary of the muscles injected  And the amount of Botulinum toxin used:   Dilution 0.9% preservative free saline mixed with 100 u Botox type A to make 5 U per 0.1cc (2 cc of saline used per 100 U botox)  Injections  Location Left  Right Units  Upper lateral orbicularis oculi   2.5   2.5   5   Upper medial orbicularis oculi   2.5   2.5   5   Lateral orbicularis oculi   5   5   10    Lower lateral orbicularis oculi   2.5   2.5   5   Lower medial orbicularis oculi  2.5   2.5   5             TOTAL UNITS:   30 Units   Agent: Botulinum Type A ( Onobotulinum Toxin type A ).  1 vials of Botox were used, each diluted with 2 cc of preservative free  saline (50 units per 1 cc)   Total injected (Units): 30 units  Total wasted (Units): 70   Pt tolerated procedure well without complications.   Reinjection is anticipated in 3 months.

## 2012-09-27 ENCOUNTER — Encounter (HOSPITAL_COMMUNITY): Payer: Self-pay | Admitting: *Deleted

## 2012-09-29 ENCOUNTER — Encounter (HOSPITAL_COMMUNITY): Payer: Self-pay | Admitting: Pharmacy Technician

## 2012-10-12 ENCOUNTER — Ambulatory Visit (HOSPITAL_COMMUNITY): Payer: Medicare Other | Admitting: Anesthesiology

## 2012-10-12 ENCOUNTER — Encounter (HOSPITAL_COMMUNITY): Payer: Self-pay | Admitting: *Deleted

## 2012-10-12 ENCOUNTER — Encounter (HOSPITAL_COMMUNITY): Payer: Self-pay | Admitting: Anesthesiology

## 2012-10-12 ENCOUNTER — Encounter (HOSPITAL_COMMUNITY)
Admission: RE | Disposition: A | Discharge status: Home / Self Care | Payer: Self-pay | Source: Ambulatory Visit | Attending: Internal Medicine

## 2012-10-12 ENCOUNTER — Ambulatory Visit (HOSPITAL_COMMUNITY)
Admission: RE | Admit: 2012-10-12 | Discharge: 2012-10-12 | Disposition: A | Discharge status: Home / Self Care | Payer: Medicare Other | Source: Ambulatory Visit | Attending: Internal Medicine | Admitting: Internal Medicine

## 2012-10-12 DIAGNOSIS — D126 Benign neoplasm of colon, unspecified: Secondary | ICD-10-CM

## 2012-10-12 DIAGNOSIS — Z860100 Personal history of colon polyps, unspecified: Secondary | ICD-10-CM | POA: Insufficient documentation

## 2012-10-12 DIAGNOSIS — J4489 Other specified chronic obstructive pulmonary disease: Secondary | ICD-10-CM | POA: Insufficient documentation

## 2012-10-12 DIAGNOSIS — Z9981 Dependence on supplemental oxygen: Secondary | ICD-10-CM | POA: Insufficient documentation

## 2012-10-12 DIAGNOSIS — G709 Myoneural disorder, unspecified: Secondary | ICD-10-CM | POA: Insufficient documentation

## 2012-10-12 DIAGNOSIS — M129 Arthropathy, unspecified: Secondary | ICD-10-CM | POA: Insufficient documentation

## 2012-10-12 DIAGNOSIS — Z9181 History of falling: Secondary | ICD-10-CM | POA: Insufficient documentation

## 2012-10-12 DIAGNOSIS — K621 Rectal polyp: Secondary | ICD-10-CM | POA: Insufficient documentation

## 2012-10-12 DIAGNOSIS — G608 Other hereditary and idiopathic neuropathies: Secondary | ICD-10-CM | POA: Insufficient documentation

## 2012-10-12 DIAGNOSIS — R490 Dysphonia: Secondary | ICD-10-CM | POA: Insufficient documentation

## 2012-10-12 DIAGNOSIS — J449 Chronic obstructive pulmonary disease, unspecified: Secondary | ICD-10-CM | POA: Insufficient documentation

## 2012-10-12 DIAGNOSIS — Z87891 Personal history of nicotine dependence: Secondary | ICD-10-CM | POA: Insufficient documentation

## 2012-10-12 DIAGNOSIS — K635 Polyp of colon: Secondary | ICD-10-CM

## 2012-10-12 DIAGNOSIS — Z1211 Encounter for screening for malignant neoplasm of colon: Secondary | ICD-10-CM | POA: Insufficient documentation

## 2012-10-12 DIAGNOSIS — K620 Anal polyp: Secondary | ICD-10-CM | POA: Insufficient documentation

## 2012-10-12 HISTORY — DX: Shortness of breath: R06.02

## 2012-10-12 HISTORY — PX: COLONOSCOPY WITH PROPOFOL: SHX5780

## 2012-10-12 HISTORY — DX: Progressive supranuclear ophthalmoplegia (steele-Richardson-olszewski): G23.1

## 2012-10-12 HISTORY — DX: Myoneural disorder, unspecified: G70.9

## 2012-10-12 SURGERY — COLONOSCOPY WITH PROPOFOL
Anesthesia: Monitor Anesthesia Care

## 2012-10-12 MED ORDER — PROPOFOL 10 MG/ML IV EMUL
INTRAVENOUS | Status: DC | PRN
  Administered 2012-10-12: 200 ug/kg/min via INTRAVENOUS

## 2012-10-12 MED ORDER — LACTATED RINGERS IV SOLN
INTRAVENOUS | Status: DC | PRN
  Administered 2012-10-12: 08:00:00 via INTRAVENOUS

## 2012-10-12 MED ORDER — PROPOFOL 10 MG/ML IV BOLUS
INTRAVENOUS | Status: DC | PRN
  Administered 2012-10-12: 50 mg via INTRAVENOUS

## 2012-10-12 MED ORDER — METOCLOPRAMIDE HCL 5 MG/ML IJ SOLN
INTRAMUSCULAR | Status: DC | PRN
  Administered 2012-10-12: 10 mg via INTRAVENOUS

## 2012-10-12 MED ORDER — PHENYLEPHRINE HCL 10 MG/ML IJ SOLN
INTRAMUSCULAR | Status: DC | PRN
  Administered 2012-10-12: 40 ug via INTRAVENOUS
  Administered 2012-10-12 (×3): 80 ug via INTRAVENOUS
  Administered 2012-10-12: 40 ug via INTRAVENOUS

## 2012-10-12 SURGICAL SUPPLY — 21 items

## 2012-10-12 NOTE — Op Note (Signed)
[facility] [address] [place], [address]   COLONOSCOPY PROCEDURE REPORT  PATIENT: Spencer Montoya, Spencer Montoya  MR#: [id number] BIRTHDATE: 1936/01/21 , 76  yrs. old GENDER: Male ENDOSCOPIST: Beverley Fiedler, MD REFERRED XB:JYNW, Fayrene Fearing PROCEDURE DATE:  10/12/2012 PROCEDURE:   Colonoscopy, surveillance ASA CLASS:   Class III INDICATIONS:Patient's personal history of colon polyps. MEDICATIONS: MAC sedation, administered by CRNA and See Anesthesia Report.  DESCRIPTION OF PROCEDURE:   After the risks benefits and alternatives of the procedure were thoroughly explained, informed consent was obtained.  A digital rectal exam revealed no rectal mass.   The EC-[id number] ([id number]) and Pentax Ped Colon [id number] endoscope was introduced through the anus and advanced to the terminal ileum which was intubated for a short distance. No adverse events experienced.   The quality of the prep was good, using MoviPrep  The instrument was then slowly withdrawn as the colon was fully examined.   COLON FINDINGS: The mucosa appeared normal in the terminal ileum. Three diminutive sessile polyps were found in the rectum. Polypectomy was performed with cold forceps.  All resections were complete and all polyp tissue was completely retrieved.   The colon mucosa was otherwise normal.  Retroflexed views revealed no abnormalities.      The scope was withdrawn and the procedure completed COMPLICATIONS: There were no complications.  ENDOSCOPIC IMPRESSION: 1.   Normal mucosa in the terminal ileum 2.   Three diminutive sessile polyps were found in the rectum; Polypectomy was performed with cold forceps 3.   The colon mucosa was otherwise normal  RECOMMENDATIONS: 1.  Await pathology results 2.  Timing of repeat colonoscopy will be determined by pathology findings. 3.  Given your age, you may not need another screening colonoscopy for colon cancer screening or polyp surveillance.  These types of tests  usually stop around the age 57.  This decision can be discussed with your primary care doctor at that time.   eSigned:  Beverley Fiedler, MD 10/12/2012 9:34 AM   cc: The Patient

## 2012-10-12 NOTE — Anesthesia Postprocedure Evaluation (Signed)
Anesthesia Post Note  Patient: Spencer Montoya  Procedure(s) Performed: Procedure(s) (LRB): COLONOSCOPY WITH PROPOFOL (N/A)  Anesthesia type: MAC  Patient location: PACU  Post pain: Pain level controlled  Post assessment: Post-op Vital signs reviewed  Last Vitals: BP 122/72  Temp(Src) 36.3 C (Oral)  Resp 6  Ht 6\' 1"  (1.854 m)  Wt 166 lb (75.297 kg)  BMI 21.91 kg/m2  SpO2 96%  Post vital signs: Reviewed  Level of consciousness: awake  Complications: No apparent anesthesia complications

## 2012-10-12 NOTE — H&P (Signed)
HPI: Spencer Montoya is a 77 yo male with PMH of COPD, now on home oxygen, supranuclear palsy, and colon polyps who was seen in consultation at the request of Dr. Jonny Ruiz for consideration of surveillance colonoscopy in Feb 2014. He presents today for outpatient surveillance colonoscopy.  After multiple attempts we were unable to obtain previous colon report, thus we will assume his previous polyps were adenomatous.  This being the case, he is due for this procedure.  He wishes to proceed.  No new issues.  No cp, dyspnea, fever, chills.  Still freq falling.  Past Medical History  Diagnosis Date  . Dysphonia   . Supranuclear palsy 01/20/2011  . Colon polyp     Colonoscopy-[facility]   . Arthritis     Back  . Progressive supranuclear palsy   . Shortness of breath   . Neuromuscular disorder   . COPD (chronic obstructive pulmonary disease)     STAGE 2    Past Surgical History  Procedure Laterality Date  . Tonsillectomy    . Cataract extraction      No current facility-administered medications for this encounter.   Facility-Administered Medications Ordered in Other Encounters  Medication Dose Route Frequency Provider Last Rate Last Dose  . lactated ringers infusion    Continuous PRN Lattie Haw, CRNA        No Known Allergies  Family History  Problem Relation Age of Onset  . Diabetes    . Hypertension    . Stroke Other     History  Substance Use Topics  . Smoking status: Former Smoker -- 1.00 packs/day for 50 years    Quit date: 06/30/2006  . Smokeless tobacco: Never Used  . Alcohol Use: No     Comment: rare    ROS: As per history of present illness, otherwise negative  Temp(Src) 98 F (36.7 C) (Oral)  Ht 6\' 1"  (1.854 m)  Wt 166 lb (75.297 kg)  BMI 21.91 kg/m2 Constitutional: Chronically ill-appearing male wearing oxygen in no acute distress  HEENT: Normocephalic and atraumatic. Oropharynx is clear and moist. No oropharyngeal exudate. Conjunctivae  are normal. No scleral icterus.  Neck: Neck supple. Trachea midline.  Cardiovascular: Normal rate, regular rhythm and intact distal pulses. 2/6 systolic ejection murmur  Pulmonary/chest: Somewhat distant without wheezing rales or rhonchi  Abdominal: Soft, nontender, nondistended. Bowel sounds active throughout.  Extremities: no clubbing, cyanosis, or edema  Skin: Skin is warm and dry. Multiple skin abrasions bilateral upper extremities  Psychiatric: Flat affect al.  RELEVANT LABS AND IMAGING: CBC    Component Value Date/Time   WBC 11.8* 05/04/2012 1620   RBC 4.91 05/04/2012 1620   HGB 15.1 05/04/2012 1620   HCT 46.1 05/04/2012 1620   PLT 264.0 05/04/2012 1620   MCV 93.9 05/04/2012 1620   MCHC 32.8 05/04/2012 1620   RDW 14.4 05/04/2012 1620   LYMPHSABS 2.1 05/04/2012 1620   MONOABS 1.2* 05/04/2012 1620   EOSABS 0.2 05/04/2012 1620   BASOSABS 0.0 05/04/2012 1620    CMP     Component Value Date/Time   NA 135 05/04/2012 1620   K 4.0 05/04/2012 1620   CL 103 05/04/2012 1620   CO2 24 05/04/2012 1620   GLUCOSE 98 05/04/2012 1620   BUN 21 05/04/2012 1620   CREATININE 1.2 05/04/2012 1620   CALCIUM 9.1 05/04/2012 1620   PROT 6.9 05/04/2012 1620   ALBUMIN 4.2 05/04/2012 1620   AST 15 05/04/2012 1620   ALT 4 05/04/2012 1620  ALKPHOS 63 05/04/2012 1620   BILITOT 0.7 05/04/2012 1620    ASSESSMENT/PLAN:  77 yo male with PMH of COPD, now on home oxygen, supranuclear palsy, and colon polyps who was seen in consultation at the request of Dr. Jonny Ruiz for consideration of surveillance colonoscopy in Feb 2014.   1. CRC screening/surveillance colonoscopy -- The nature of the procedure, as well as the risks, benefits, and alternatives were carefully and thoroughly reviewed with the patient. Ample time for discussion and questions allowed. The patient understood, was satisfied, and agreed to proceed.

## 2012-10-12 NOTE — Anesthesia Preprocedure Evaluation (Addendum)
Anesthesia Evaluation  Patient identified by MRN, date of birth, ID band Patient awake    Reviewed: Allergy & Precautions, H&P , NPO status , Patient's Chart, lab work & pertinent test results  Airway Mallampati: II TM Distance: >3 FB Neck ROM: Full    Dental  (+) Dental Advisory Given and Teeth Intact   Pulmonary shortness of breath and with exertion, COPDformer smoker,  breath sounds clear to auscultation        Cardiovascular negative cardio ROS  Rhythm:Regular Rate:Normal     Neuro/Psych Progressive Supranuclear Palsy negative psych ROS   GI/Hepatic negative GI ROS, Neg liver ROS,   Endo/Other  negative endocrine ROS  Renal/GU negative Renal ROS     Musculoskeletal negative musculoskeletal ROS (+)   Abdominal   Peds  Hematology negative hematology ROS (+)   Anesthesia Other Findings   Reproductive/Obstetrics                         Anesthesia Physical Anesthesia Plan  ASA: II  Anesthesia Plan: MAC   Post-op Pain Management:    Induction: Intravenous  Airway Management Planned:   Additional Equipment:   Intra-op Plan:   Post-operative Plan:   Informed Consent: I have reviewed the patients History and Physical, chart, labs and discussed the procedure including the risks, benefits and alternatives for the proposed anesthesia with the patient or authorized representative who has indicated his/her understanding and acceptance.   Dental advisory given  Plan Discussed with: CRNA  Anesthesia Plan Comments:         Anesthesia Quick Evaluation

## 2012-10-12 NOTE — Transfer of Care (Signed)
Immediate Anesthesia Transfer of Care Note  Patient: Spencer Montoya  Procedure(s) Performed: Procedure(s): COLONOSCOPY WITH PROPOFOL (N/A)  Patient Location: PACU  Anesthesia Type:MAC  Level of Consciousness: patient cooperative and responds to stimulation, drowsy, ventilating well  Airway & Oxygen Therapy: Patient Spontanous Breathing and Patient connected to face mask oxygen  Post-op Assessment: Report given to PACU RN, Post -op Vital signs reviewed and stable and Patient moving all extremities  Post vital signs: Reviewed and stable  Complications: No apparent anesthesia complications

## 2012-10-12 NOTE — Preoperative (Signed)
Beta Blockers   Reason not to administer Beta Blockers:Not Applicable, not on home BB 

## 2012-10-13 ENCOUNTER — Encounter (HOSPITAL_COMMUNITY): Payer: Self-pay | Admitting: Internal Medicine

## 2012-10-13 ENCOUNTER — Encounter: Payer: Self-pay | Admitting: Internal Medicine

## 2012-10-17 ENCOUNTER — Other Ambulatory Visit: Payer: Self-pay | Admitting: Neurology

## 2012-10-18 ENCOUNTER — Other Ambulatory Visit: Payer: Self-pay | Admitting: Neurology

## 2012-10-18 ENCOUNTER — Encounter: Payer: Self-pay | Admitting: Neurology

## 2012-10-18 MED ORDER — CARBIDOPA-LEVODOPA ER 50-200 MG PO TBCR: 2.0000 | EXTENDED_RELEASE_TABLET | Freq: Three times a day (TID) | ORAL | Status: DC

## 2012-10-21 ENCOUNTER — Telehealth: Payer: Self-pay | Admitting: Gastroenterology

## 2012-10-21 NOTE — Telephone Encounter (Signed)
Received faxed from [facility], they have no record of pt.

## 2012-12-12 ENCOUNTER — Emergency Department (HOSPITAL_BASED_OUTPATIENT_CLINIC_OR_DEPARTMENT_OTHER): Payer: Medicare Other

## 2012-12-12 ENCOUNTER — Encounter (HOSPITAL_BASED_OUTPATIENT_CLINIC_OR_DEPARTMENT_OTHER): Payer: Self-pay

## 2012-12-12 ENCOUNTER — Emergency Department (HOSPITAL_BASED_OUTPATIENT_CLINIC_OR_DEPARTMENT_OTHER)
Admission: EM | Admit: 2012-12-12 | Discharge: 2012-12-12 | Disposition: A | Discharge status: Home / Self Care | Payer: Medicare Other | Attending: Emergency Medicine | Admitting: Emergency Medicine

## 2012-12-12 DIAGNOSIS — J449 Chronic obstructive pulmonary disease, unspecified: Secondary | ICD-10-CM | POA: Insufficient documentation

## 2012-12-12 DIAGNOSIS — Z9181 History of falling: Secondary | ICD-10-CM | POA: Insufficient documentation

## 2012-12-12 DIAGNOSIS — Z791 Long term (current) use of non-steroidal anti-inflammatories (NSAID): Secondary | ICD-10-CM | POA: Insufficient documentation

## 2012-12-12 DIAGNOSIS — Z860100 Personal history of colon polyps, unspecified: Secondary | ICD-10-CM | POA: Insufficient documentation

## 2012-12-12 DIAGNOSIS — Z7982 Long term (current) use of aspirin: Secondary | ICD-10-CM | POA: Insufficient documentation

## 2012-12-12 DIAGNOSIS — R296 Repeated falls: Secondary | ICD-10-CM | POA: Insufficient documentation

## 2012-12-12 DIAGNOSIS — Z87891 Personal history of nicotine dependence: Secondary | ICD-10-CM | POA: Insufficient documentation

## 2012-12-12 DIAGNOSIS — J4489 Other specified chronic obstructive pulmonary disease: Secondary | ICD-10-CM | POA: Insufficient documentation

## 2012-12-12 DIAGNOSIS — M129 Arthropathy, unspecified: Secondary | ICD-10-CM | POA: Insufficient documentation

## 2012-12-12 DIAGNOSIS — Y92009 Unspecified place in unspecified non-institutional (private) residence as the place of occurrence of the external cause: Secondary | ICD-10-CM | POA: Insufficient documentation

## 2012-12-12 DIAGNOSIS — S46911A Strain of unspecified muscle, fascia and tendon at shoulder and upper arm level, right arm, initial encounter: Secondary | ICD-10-CM

## 2012-12-12 DIAGNOSIS — Z79899 Other long term (current) drug therapy: Secondary | ICD-10-CM | POA: Insufficient documentation

## 2012-12-12 DIAGNOSIS — R490 Dysphonia: Secondary | ICD-10-CM | POA: Insufficient documentation

## 2012-12-12 DIAGNOSIS — Z8669 Personal history of other diseases of the nervous system and sense organs: Secondary | ICD-10-CM | POA: Insufficient documentation

## 2012-12-12 DIAGNOSIS — IMO0002 Reserved for concepts with insufficient information to code with codable children: Secondary | ICD-10-CM | POA: Insufficient documentation

## 2012-12-12 DIAGNOSIS — Z8709 Personal history of other diseases of the respiratory system: Secondary | ICD-10-CM | POA: Insufficient documentation

## 2012-12-12 DIAGNOSIS — Y9389 Activity, other specified: Secondary | ICD-10-CM | POA: Insufficient documentation

## 2012-12-12 MED ORDER — TRAMADOL HCL 50 MG PO TABS: 50.0000 mg | ORAL_TABLET | Freq: Four times a day (QID) | ORAL | Status: DC | PRN

## 2012-12-12 NOTE — ED Provider Notes (Signed)
History  This chart was scribed for Endrit Gittins B. Bernette Mayers, MD by Manuela Schwartz, ED scribe. This patient was seen in room MH12/MH12 and the patient's care was started at 1818.   CSN: [id number]  Arrival date & time 12/12/12  1818   First MD Initiated Contact with Patient 12/12/12 1909      Chief Complaint  Patient presents with  . Shoulder Injury   Patient is a 77 y.o. male presenting with shoulder injury. The history is provided by the patient. No language interpreter was used.  Shoulder Injury This is a recurrent problem. The current episode started more than 2 days ago. The problem has not changed since onset.Pertinent negatives include no chest pain and no shortness of breath. The symptoms are aggravated by bending. Nothing relieves the symptoms. He has tried nothing for the symptoms.   HPI Comments: Spencer Montoya is a 77 y.o. male who presents to the Emergency Department complaining of constant right shoulder pain since x3 days ago after a fall at home. He lost his voice years ago and he presents with his daughter who states that he has frequent falls. He denies any previous injuries to his right shoulder and denies any other injuries at this time. He has not tried any medicines for this problem at home. He states pain w/ROM but minimal tenderness. He states his right elbow is fine and w/normal ROM.     Past Medical History  Diagnosis Date  . Dysphonia   . Supranuclear palsy 01/20/2011  . Colon polyp     Colonoscopy-[facility]   . Arthritis     Back  . Progressive supranuclear palsy   . Shortness of breath   . Neuromuscular disorder   . COPD (chronic obstructive pulmonary disease)     STAGE 2    Past Surgical History  Procedure Laterality Date  . Tonsillectomy    . Cataract extraction    . Colonoscopy with propofol N/A 10/12/2012    Procedure: COLONOSCOPY WITH PROPOFOL;  Surgeon: Beverley Fiedler, MD;  Location: [facility] ENDOSCOPY;  Service: Gastroenterology;  Laterality: N/A;     Family History  Problem Relation Age of Onset  . Diabetes    . Hypertension    . Stroke Other     History  Substance Use Topics  . Smoking status: Former Smoker -- 1.00 packs/day for 50 years    Quit date: 06/30/2006  . Smokeless tobacco: Never Used  . Alcohol Use: No     Comment: rare      Review of Systems  Constitutional: Negative for fever and chills.  Respiratory: Negative for shortness of breath.   Cardiovascular: Negative for chest pain.  Gastrointestinal: Negative for nausea and vomiting.  Musculoskeletal:       Right shoulder pain w/painful ROM. Minimal tenderness. No there injuries.   Neurological: Negative for weakness.  All other systems reviewed and are negative.   A complete 10 system review of systems was obtained and all systems are negative except as noted in the HPI and PMH.   Allergies  Review of patient's allergies indicates no known allergies.  Home Medications   Current Outpatient Rx  Name  Route  Sig  Dispense  Refill  . aspirin 81 MG tablet   Oral   Take 81 mg by mouth daily.           . carbidopa-levodopa (SINEMET CR) 50-200 MG per tablet   Oral   Take 2 tablets by mouth 3 (three) times daily.  180 tablet   6   . Homeopathic Products ([employer] LEG CRAMPS PAIN RELIEF PO)   Oral   Take 1 capsule by mouth daily.         . naproxen sodium (ANAPROX) 220 MG tablet   Oral   Take 220 mg by mouth 2 (two) times daily with a meal.         . QVAR 80 MCG/ACT inhaler      INHALE 1 PUFF INTO THE LUNGS TWO (2) TIMES DAILY. RINSE MOUTH AFTEREACH USE.   1 Inhaler   PRN   . SEREVENT DISKUS 50 MCG/DOSE diskus inhaler      INHALE ONE PUFF BY MOUTH TWICE DAILY   60 each   PRN   . SPIRIVA HANDIHALER 18 MCG inhalation capsule      INHALE ONE CAPSULE BY MOUTH ONE TIME DAILY   30 each   PRN     Triage Vitals: BP 120/84  Pulse 73  Temp(Src) 97.8 F (36.6 C) (Oral)  Resp 20  Ht 6\' 1"  (1.854 m)  Wt 168 lb (76.204 kg)  BMI 22.17  kg/m2  SpO2 96%  Physical Exam  Nursing note and vitals reviewed. Constitutional: He appears well-developed and well-nourished.  HENT:  Head: Normocephalic and atraumatic.  Eyes: EOM are normal. Pupils are equal, round, and reactive to light.  Neck: Normal range of motion. Neck supple.  Cardiovascular: Normal rate, normal heart sounds and intact distal pulses.   Pulmonary/Chest: Effort normal and breath sounds normal.  Abdominal: Bowel sounds are normal. He exhibits no distension. There is no tenderness.  Musculoskeletal: Normal range of motion. He exhibits no edema and no tenderness.  No tenderness, but does have pain w/ROM of right shoulder  Neurological: He is alert. He has normal strength. No cranial nerve deficit or sensory deficit.  Skin: Skin is warm and dry. No rash noted.  Psychiatric: He has a normal mood and affect.    ED Course  Procedures (including critical care time) DIAGNOSTIC STUDIES: Oxygen Saturation is 89% on room air, low by my interpretation.    COORDINATION OF CARE: At 745 PM Discussed treatment plan with patient which includes right shoulder X-ray, pain medicine, and sling. Patient agrees.   Labs Reviewed - No data to display Dg Shoulder Right  12/12/2012   *RADIOLOGY REPORT*  Clinical Data: Frequent falls.  Shoulder pain  RIGHT SHOULDER - 2+ VIEW  Comparison: None.  Findings: Normal alignment and no fracture.  Minimal degenerative change in the [facility] joint.  IMPRESSION: No acute abnormality.   Original Report Authenticated By: Janeece Riggers, M.D.     1. Shoulder strain, right, initial encounter       MDM  I personally performed the services described in this documentation, which was scribed in my presence. The recorded information has been reviewed and is accurate.   No deformity, neurovascularly intact. Xray neg. Sling, pain meds and PCP or ortho followup.          Jaylise Peek B. Bernette Mayers, MD 12/12/12 Corky Crafts

## 2012-12-12 NOTE — ED Notes (Signed)
Right shoulder pain increased with ROM and lifting arm over head

## 2012-12-12 NOTE — ED Notes (Signed)
Pt's daughter states that pt falls frequently due to PSP and that he is c/o R shoulder pain from one of the falls.  PMS intact, ROM limited d/t pain

## 2012-12-24 ENCOUNTER — Encounter: Payer: Self-pay | Admitting: Internal Medicine

## 2012-12-24 ENCOUNTER — Ambulatory Visit (INDEPENDENT_AMBULATORY_CARE_PROVIDER_SITE_OTHER): Payer: Medicare Other | Admitting: Internal Medicine

## 2012-12-24 VITALS — BP 88/56 | HR 75 | Ht 73.0 in | Wt 162.4 lb

## 2012-12-24 DIAGNOSIS — J3800 Paralysis of vocal cords and larynx, unspecified: Secondary | ICD-10-CM

## 2012-12-24 DIAGNOSIS — J449 Chronic obstructive pulmonary disease, unspecified: Secondary | ICD-10-CM

## 2012-12-24 NOTE — Progress Notes (Signed)
Patient ID: Spencer Montoya, male    DOB: [birth year], 77 y.o.   MRN: [id number]  HPI 01/20/11-  23 yoM former smoker, here with daughter,  seen for pulmonary evaluation at request of daughter who was former patient here.  50 Pack year smoking hx, QS x 5 years. Dx'd COPD stage 2, about 4-5 years ago while living in [place] state. He moved to [place] 1 year ago, and moves here now to live with daughter. Widower, retired from [employer]..  Home O2 used for sleep x 2 years, but this was discontinued on move from [place], off x 1 week, needing to get re-established.  Describes little day to day change in breathing- probably worse in past year. . DOE around the house, but doesn't wake him. Does wheeze,  But controlled with Qvar 80, Serevent, Spiriva. Coughs only swallowing liquids. Swallowing eval reported negative- 09/05/10. Marland Kitchen  Has Supranuclear Palsy "form of Parkinson's", with no recent neurology evaluation. Aphonia  from this is almost total x 2 years- faintly whispers. Had Speech therapy [facility] Limited mobility - uses walker. Denies heart disease Pneumonia x 1, 3-4 years ago. Had pneumovax. Gets flu vax. PFT 03/27/10- FEV1 2.15/ 68%; FEV1/FVC 0.77, No response to BD; TLC 115%; RV/ TLC 132%;  CXR- 03/05/10- NAD  02/20/11- 81 year old former smoker followed for COPD, complicated by Supranuclear palsy/ vocal paresis After last visit, initial PCP visit is pending 02/28/11- he asks I refill carbidopa-levo ER 50-200, 1 TID, pending establishing a PCP. Then we expect PCP will refer to a Neurologist to establish.   . Home O2 started and helps, but he came today without it and resting room air sat 92% is better than 87% last time.  Now using mostly for sleep.  CXR 01/20/11- COPD, NAD.   06/26/11-  40 year old former smoker followed for COPD, complicated by Supranuclear palsy/ vocal paresis.  Daughter here Had flu vaccine. Daughter comments that his voice remains in audible except when he has a  cold, at which time voice gets huskier and he is much easier to understand. Since starting home oxygen he has been sleeping much better. They deny choking with food or drink. Chest x-ray 06/06/2011 was done when he fell and broke ribs.-Stable right posterior/ lateral rib fractures, emphysema.  12/25/11- 12 year old former smoker followed for COPD, complicated by Supranuclear palsy/ vocal paresis.   His voice has gotten no better after evaluation by Dr. Terri Skains and by the voice clinic at [facility]. Easy dyspnea on exertion is no worse, noticed mainly crossing street briskly or climbing stairs. He denies cough or wheeze. Breathing is not waking. He admits choking a little some times swallowing, which has been my biggest concern with his voice problem, but he minimizes it and indicates he is careful.   COPD assessment test (CAT) score 16/40 CXR 11/22/11- reviewed IMPRESSION:  1. Emphysema.  2. Bilateral lower rib deformities compatible with old fractures.  3. Atherosclerosis.  Original Report Authenticated By: Dellia Cloud, M.D.    06/25/12- 56 year old former smoker followed for COPD, complicated by Supranuclear palsy/ vocal paresis.  Daughter here 6 month follow up.  c/o increased DOE over the past month.  O2 sat 86% on RA upon arrival to exam room.  Also, reports wheezing and prod cough.  No chest tightness, chest pain, or f/c/s.   Had fallen and hit chest on his walker leaving a bruise which is now resolving. Increased wheeze but not acutely ill. Oxygen 2 L for sleep/? Am Home Pt.  CXR 11/13 /13 IMPRESSION:  COPD without acute abnormality.  Original Report Authenticated By: Alcide Clever, M.D.   07/16/12- 55 year old former smoker followed for COPD, complicated by Supranuclear palsy/ vocal paresis.   ACUTE VISIT: wheezing, coughing, increased mucus. He remains unable to speak but brings a note from his daughter who has been treated for a sinus infection to which the patient has been  exposed. He indicates wheeze, cough and increased mucus but he denies sinus pain, pressure or postnasal drip. He continues oxygen at 2 L/ Advanced  12/24/12- 58 year old former smoker followed for COPD, complicated by Supranuclear palsy/ vocal paresis 6 month follow up.  Breathing unchanged.  Does have DOE.  No chest tightness, chest pain, or cough at this time.  Daughter here/living with him. Arrive with portable oxygen empty and switched to 2 hours. We discussed checking his oxygen supply before leaving the house. They deny acute respiratory issues now. O2 2L/ Advanced.  Review of Systems-see HPI Constitutional:   No-   weight loss, night sweats, fevers, chills, fatigue, lassitude. HEENT:   No-   headaches, difficulty swallowing, tooth/dental problems, sore throat,                  No-   sneezing, itching, ear ache, nasal congestion, post nasal drip,  CV:  No-   chest pain, orthopnea, PND, swelling in lower extremities, anasarca, dizziness, palpitations GI:  No-   heartburn, indigestion, abdominal pain, nausea, vomiting, diarrhea,                 change in bowel habits, loss of appetite Resp:  No-  excess mucus, + dyspnea with exertion              No-  coughing up of blood.              No-   change in color of mucus.   Skin: No-   rash or lesions. GU:  MS:  No-   joint pain or swelling.  Psych:  No- change in mood or affect. No depression or anxiety.  No memory loss. Neuro: frequent falls, chronic aphonia Objective:   Physical Exam General- Alert, Oriented, Affect-appropriate, Distress- none acute;  Medium build; rolling walker. O2 2L  + Aphonic- he makes no effort now to talk Skin- + scabs and scars on his forearms Lymphadenopathy- none Head- atraumatic            Eyes- Gross vision intact, PERRLA, conjunctivae clear secretions            Ears- Hearing, canals-normal            Nose- Clear, no-Septal dev, mucus, polyps, erosion, perforation             Throat- Mallampati II ,  mucosa clear , drainage- none, tonsils- atrophic Neck- flexible , trachea midline, no stridor , thyroid nl, carotid no bruit Chest - symmetrical excursion , unlabored           Heart/CV- RRR , no murmur , no gallop  , no rub, nl s1 s2                           - JVD- none , edema- none, stasis changes- none, varices- none           Lung- clear, distant, unlabored, wheeze- none, cough- none , dullness-none, rub- none  Chest wall-  Abd-  Br/ Gen/ Rectal- Not done, not indicated Extrem- cyanosis- none, clubbing, none, atrophy- none, strength- nl Neuro- uses a walker. Makes no effort to speak today.

## 2012-12-24 NOTE — Patient Instructions (Addendum)
We can continue present meds  We will continue O2 at 2L/ Advanced  Please call as needed

## 2012-12-27 ENCOUNTER — Ambulatory Visit (INDEPENDENT_AMBULATORY_CARE_PROVIDER_SITE_OTHER): Payer: Medicare Other | Admitting: Neurology

## 2012-12-27 ENCOUNTER — Encounter: Payer: Self-pay | Admitting: Neurology

## 2012-12-27 VITALS — BP 88/56 | HR 96 | Temp 97.9°F | Resp 22 | Wt 161.0 lb

## 2012-12-27 DIAGNOSIS — G231 Progressive supranuclear ophthalmoplegia [Steele-Richardson-Olszewski]: Secondary | ICD-10-CM

## 2012-12-27 DIAGNOSIS — I959 Hypotension, unspecified: Secondary | ICD-10-CM

## 2012-12-27 DIAGNOSIS — G245 Blepharospasm: Secondary | ICD-10-CM

## 2012-12-27 DIAGNOSIS — G238 Other specified degenerative diseases of basal ganglia: Secondary | ICD-10-CM

## 2012-12-27 MED ORDER — ONABOTULINUMTOXINA 100 UNITS IJ SOLR
100.0000 [IU] | Freq: Once | INTRAMUSCULAR | Status: AC
  Administered 2012-12-27: 100 [IU] via INTRAMUSCULAR

## 2012-12-27 NOTE — Procedures (Signed)
[facility]   Procedure Note Botox  Attending: Dr. Lurena Joiner Tat  Preoperative Diagnosis(es): Blepharospasm   Clinical history:  The patient did great with his last botox.  For the first time in years, he had sustained opening of the lids.  He noted some wearing off a few weeks ago but it lasted much longer than the one previously.  It has not yet completely worn off (not even close to baseline per daughter) but it has worn off enough that he has to pull the lids open 2-3 times per day now.  There were no SE with last injection.   Consent obtained from: The patient  Benefits discussed included, but were not limited to ability to more easily open eyes and therefore see better.  Risk discussed included, but were not limited pain and discomfort, bleeding, bruising, excessive weakness, venous thrombosis, muscle atrophy and drooping of eyelids.  A copy of the patient medication guide was given to the patient which explains the blackbox warning.  Patients identity and treatment sites confirmed yes.  Details of Procedure: Skin was cleaned with alcohol.  A 30 gauge, 1/2 inch needle was introduced to the target muscle.  Prior to injection, the needle plunger was aspirated to make sure the needle was not within a blood vessel.  There was no blood retrieved on aspiration.    Following is a summary of the muscles injected  And the amount of Botulinum toxin used:   Dilution 0.9% preservative free saline mixed with 100 u Botox type A to make 5 U per 0.1cc (2 cc of saline used per 100 U botox)  Injections  Location  Left   Right  Units   Upper lateral orbicularis oculi    2.5    2.5    5    Upper medial orbicularis oculi    2.5    2.5    5    Lateral orbicularis oculi    5    5    10     Lower lateral orbicularis oculi    2.5    2.5    5    Lower medial orbicularis oculi   2.5    2.5    5                      TOTAL UNITS:      30 Units    Agent: Botulinum Type A ( Onobotulinum Toxin type A ).   1 vials of Botox were used, each diluted with 2 cc of preservative free saline (50 units per 1 cc)              Total injected (Units): 30 units             Total wasted (Units): 70   Pt tolerated procedure well without complications.    Reinjection is anticipated in 3 months.

## 2012-12-27 NOTE — Progress Notes (Signed)
Spencer Montoya was seen today in the movement disorders clinic for neurologic f/u.  The patient was previously seen by Dr. Modesto Charon but since he is no longer with the practice I am resuming the patient's care.  I reviewed Dr. Nash Dimmer prior notes.  This patient is accompanied in the office by his daughter [name]) who supplements the history.    The consultation is for the evaluation of PSP.  The patient was diagnosed with progressive supranuclear palsy about 4 years ago in [place] after multiple falls.   He still has falls up to multiple times per day, and no falls on rare days.  His last significant fall was in May after hitting his head.  He had stitches in the head.  Last November he broke ribs after a fall.   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 Modesto Charon recommended that the patient not drive.  Unfortunately, the patient has not followed that recommendation.  The patient currently lives with his daughter who recently fell at home that was modified to make it handicap accessible.  His daughter does work outside the home.  There is no other help available, but the patient and his daughter do not think that he needs further help.  UPDATE 12/27/12:  The patient is currently on carbidopa/levodopa CR 50/200, 2 tablets 3 times a day.   At the end of the day, he has a lot of trouble getting out of the chair but refuses to try a lift chair.  He falls frequently, usually when turning around or reaching over the walker to get something; generally he does not fall if walking forward.  Was in ER since last visit.  Notes reviewed.     Specific Symptoms:  Tremor: no Voice: vocal cord paralysis, almost completely unable to phonate for 2 years Sleep: sleeps well  Vivid Dreams:  no  Acting out dreams:  no Wet Pillows: no...occasionally during the day Postural symptoms:  yes  Falls?  yes Bradykinesia symptoms: difficulty with initiating movement and reduced facial expressions...1 step into house.   Lives with  Daughter.  Just built house that was handicap accesible.   Loss of smell:  yes Loss of taste:  no Urinary Incontinence:  yes Difficulty Swallowing:  yes...chokes some on liquids but daughter states better than it was Handwriting, micrographia: yes Trouble with ADL's:  no  Trouble buttoning clothing: no Depression:  no, and no pathologic laughter/crying Memory changes:  yes but minor issues Hallucinations:  no  visual distortions: no N/V:  no Lightheaded:  no  Syncope: no Diplopia:  no   PREVIOUS MEDICATIONS: Sinemet CR  ALLERGIES:  No Known Allergies  CURRENT MEDICATIONS:  Current Outpatient Prescriptions on File Prior to Visit  Medication Sig Dispense Refill  . aspirin 81 MG tablet Take 81 mg by mouth daily.        . carbidopa-levodopa (SINEMET CR) 50-200 MG per tablet Take 2 tablets by mouth 3 (three) times daily.  180 tablet  6  . Homeopathic Products ([employer] LEG CRAMPS PAIN RELIEF PO) Take 1 capsule by mouth daily.      . naproxen sodium (ANAPROX) 220 MG tablet Take 220 mg by mouth 2 (two) times daily with a meal.      . QVAR 80 MCG/ACT inhaler INHALE 1 PUFF INTO THE LUNGS TWO (2) TIMES DAILY. RINSE MOUTH AFTEREACH USE.  1 Inhaler  PRN  . salmeterol (SEREVENT DISKUS) 50 MCG/DOSE diskus inhaler       . SPIRIVA HANDIHALER 18 MCG  inhalation capsule INHALE ONE CAPSULE BY MOUTH ONE TIME DAILY  30 each  PRN   No current facility-administered medications on file prior to visit.    PAST MEDICAL HISTORY:   Past Medical History  Diagnosis Date  . Dysphonia   . Supranuclear palsy 01/20/2011  . Colon polyp     Colonoscopy-[facility]   . Arthritis     Back  . Progressive supranuclear palsy   . Shortness of breath   . Neuromuscular disorder   . COPD (chronic obstructive pulmonary disease)     STAGE 2    PAST SURGICAL HISTORY:   Past Surgical History  Procedure Laterality Date  . Tonsillectomy    . Cataract extraction    . Colonoscopy with propofol N/A  10/12/2012    Procedure: COLONOSCOPY WITH PROPOFOL;  Surgeon: Beverley Fiedler, MD;  Location: [facility];  Service: Gastroenterology;  Laterality: N/A;    SOCIAL HISTORY:   History   Social History  . Marital Status: Widowed    Spouse Name: N/A    Number of Children: N/A  . Years of Education: N/A   Occupational History  . 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 Smoking status: Former Smoker -- 1.00 packs/day for 50 years    Quit date: 06/30/2006  . Smokeless tobacco: Never Used  . Alcohol Use: No     Comment: rare  . Drug Use: No  . Sexually Active: Not on file   Other Topics Concern  . Not on file   Social History Narrative  . No narrative on file    FAMILY HISTORY:   Family Status  Relation Status Death Age  . Mother Deceased     pneumonia  . Father Deceased     CVA  . Sister Alive     healthy  . Child Alive     2, alive and well    ROS:  A complete 10 system review of systems was obtained and was unremarkable apart from what is mentioned above.  PHYSICAL EXAMINATION:    VITALS:   Filed Vitals:   12/27/12 1038  BP: 88/56  Pulse: 96  Temp: 97.9 F (36.6 C)  Resp: 22  Weight: 161 lb (73.029 kg)    GEN:  The patient appears stated age and is in NAD. HEENT:  Normocephalic, atraumatic.  The mucous membranes are moist. The superficial temporal arteries are without ropiness or tenderness. CV:  RRR Lungs:  CTAB Neck/HEME:  There are no carotid bruits bilaterally.  Neurological examination:  Orientation: The patient is alert and oriented x3. Fund of knowledge is appropriate.  Recent and remote memory are intact.  Attention and concentration are normal.    Able to name objects and repeat phrases. Cranial nerves: There is good facial symmetry. There is facial hypomimia.  The eyelids are held open, but the minute that he is asked to close the minute he needs to blink, the eyelids remain closed and he will have to manually open them.  Pupils  are equal round and reactive to light bilaterally.  There are square wave jerks.   Fundoscopic exam reveals clear margins bilaterally. Extraocular muscles are intact. The visual fields are full to confrontational testing. The speech is fluent but very hypophonic (whisper only). Soft palate rises symmetrically and there is no tongue deviation. Hearing is intact to conversational tone. Sensation: Sensation is intact to light and pinprick throughout (facial, trunk, extremities). Vibration is intact at the  bilateral big toe. There is no extinction with double simultaneous stimulation. There is no sensory dermatomal level identified. Motor: Strength is 5/5 in the bilateral upper and lower extremities.   Shoulder shrug is equal and symmetric.  There is no pronator drift. Deep tendon reflexes: Deep tendon reflexes are 2/4 at the bilateral biceps, triceps, brachioradialis, patella and achilles. Plantar responses are downgoing bilaterally.  Movement examination: Tone: There is just slightly increased tone in the left upper extremity.  Tone in the right upper extremity is normal.  Tone in the lower extremities is normal.  Abnormal movements: None. Coordination:  There is minimally decreased decremation with RAM's, just noted with heel taps and toe taps bilaterally. Gait and Station: The patient has significant difficulty arising out of a deep-seated chair without the use of the hands.  He has significant retropulsion.  He has a stooped posture.  He is very unstable.    LABS:    Chemistry      Component Value Date/Time   NA 135 05/04/2012 1620   K 4.0 05/04/2012 1620   CL 103 05/04/2012 1620   CO2 24 05/04/2012 1620   BUN 21 05/04/2012 1620   CREATININE 1.2 05/04/2012 1620      Component Value Date/Time   CALCIUM 9.1 05/04/2012 1620   ALKPHOS 63 05/04/2012 1620   AST 15 05/04/2012 1620   ALT 4 05/04/2012 1620   BILITOT 0.7 05/04/2012 1620       Lab Results  Component Value Date   WBC 11.8* 05/04/2012    HGB 15.1 05/04/2012   HCT 46.1 05/04/2012   MCV 93.9 05/04/2012   PLT 264.0 05/04/2012   Lab Results  Component Value Date   TSH 1.68 05/04/2012   No results found for this basename: VITAMINB12     ASSESSMENT:   1.  Progressive supranuclear palsy.  I agree with the diagnosis.  The patient's biggest problems at this point in time are falls and hypotension.  He is at significant risk for fracture and aspiration.  PLAN:   1.  I think a lift chair would be of value but the pt refuses.  I told them that he could take an extra levodopa in the evening, when he has the most problems getting out of the chair.    Risks, benefits, side effects and alternative therapies were discussed.  The opportunity to ask questions was given and they were answered to the best of my ability.  The patient expressed understanding and willingness to follow the outlined treatment protocols. 2.  Hypotension related to autonomic insufficiency associated with PSP.  He is asymptommatic right now and this does not seem to be cause of falls, but we will need to watch this very closely and may need to add further medication for this. 3.  Botox was givin today for the eyelid opening apraxia (separate procedural note)

## 2013-01-09 ENCOUNTER — Encounter: Payer: Self-pay | Admitting: Internal Medicine

## 2013-01-09 NOTE — Assessment & Plan Note (Signed)
He does not seem to be experiencing either aspiration/choke and, or difficulty with airway clearing

## 2013-01-09 NOTE — Assessment & Plan Note (Signed)
Stable COPD with chronic hypoxic respiratory failure Plan-discussed with patient and daughter the need to check portable oxygen before leaving the house

## 2013-01-10 ENCOUNTER — Telehealth: Payer: Self-pay | Admitting: Neurology

## 2013-01-10 NOTE — Telephone Encounter (Signed)
She should definitely take him to see his primary care physician in the meantime, especially since there is concern for an infection.

## 2013-01-10 NOTE — Telephone Encounter (Signed)
Left a message for Carol to return my call.

## 2013-01-10 NOTE — Telephone Encounter (Signed)
Picked up a call from [place], the patient's daughter. She states her dad had Botox on 06/30. He has had several Botox treatments with Dr. Arbutus Leas (pt with blephraspasm). She states that after the procedure, he usually experiences watery eyes for about a week and then it goes away. This time however, the watering has continued and gotten worse with time. His left eye is now red and swollen on the lid and underneath and itches fairly significantly. It has now started to have "white/yellowish drainage". She is fairly sure that he has some sort of eye infection. She is wanting to know what to do. I told her that I would check with Dr. Everlena Cooper and get his recommendation and give her a call back when I knew something. She is aware Dr. Arbutus Leas is out of the office this week. She is ok to wait. **Dr. Everlena Cooper, please advise. Thank you.

## 2013-01-11 ENCOUNTER — Encounter: Payer: Self-pay | Admitting: Internal Medicine

## 2013-01-11 ENCOUNTER — Ambulatory Visit (INDEPENDENT_AMBULATORY_CARE_PROVIDER_SITE_OTHER): Payer: Medicare Other | Admitting: Internal Medicine

## 2013-01-11 VITALS — BP 90/58 | HR 108 | Temp 97.0°F | Wt 160.1 lb

## 2013-01-11 DIAGNOSIS — H5789 Other specified disorders of eye and adnexa: Secondary | ICD-10-CM

## 2013-01-11 DIAGNOSIS — Z9109 Other allergy status, other than to drugs and biological substances: Secondary | ICD-10-CM

## 2013-01-11 DIAGNOSIS — H579 Unspecified disorder of eye and adnexa: Secondary | ICD-10-CM

## 2013-01-11 NOTE — Telephone Encounter (Signed)
Spoke with [name]. They have an appointment with his PCP for today. She decided to call yesterday afternoon. I asked her to let me know the outcome. She states she will.

## 2013-01-11 NOTE — Patient Instructions (Signed)
Allergies, Generic  Allergies may happen from anything your body is sensitive to. This may be food, medicines, pollens, chemicals, and nearly anything around you in everyday life that produces allergens. An allergen is anything that causes an allergy producing substance. Heredity is often a factor in causing these problems. This means you may have some of the same allergies as your parents.  Food allergies happen in all age groups. Food allergies are some of the most severe and life threatening. Some common food allergies are cow's milk, seafood, eggs, nuts, wheat, and soybeans.  SYMPTOMS    Swelling around the mouth.   An itchy red rash or hives.   Vomiting or diarrhea.   Difficulty breathing.  SEVERE ALLERGIC REACTIONS ARE LIFE-THREATENING.  This reaction is called anaphylaxis. It can cause the mouth and throat to swell and cause difficulty with breathing and swallowing. In severe reactions only a trace amount of food (for example, peanut oil in a salad) may cause death within seconds.  Seasonal allergies occur in all age groups. These are seasonal because they usually occur during the same season every year. They may be a reaction to molds, grass pollens, or tree pollens. Other causes of problems are house dust mite allergens, pet dander, and mold spores. The symptoms often consist of nasal congestion, a runny itchy nose associated with sneezing, and tearing itchy eyes. There is often an associated itching of the mouth and ears. The problems happen when you come in contact with pollens and other allergens. Allergens are the particles in the air that the body reacts to with an allergic reaction. This causes you to release allergic antibodies. Through a chain of events, these eventually cause you to release histamine into the blood stream. Although it is meant to be protective to the body, it is this release that causes your discomfort. This is why you were given anti-histamines to feel better. If you are  unable to pinpoint the offending allergen, it may be determined by skin or blood testing. Allergies cannot be cured but can be controlled with medicine.  Hay fever is a collection of all or some of the seasonal allergy problems. It may often be treated with simple over-the-counter medicine such as diphenhydramine. Take medicine as directed. Do not drink alcohol or drive while taking this medicine. Check with your caregiver or package insert for child dosages.  If these medicines are not effective, there are many new medicines your caregiver can prescribe. Stronger medicine such as nasal spray, eye drops, and corticosteroids may be used if the first things you try do not work well. Other treatments such as immunotherapy or desensitizing injections can be used if all else fails. Follow up with your caregiver if problems continue. These seasonal allergies are usually not life threatening. They are generally more of a nuisance that can often be handled using medicine.  HOME CARE INSTRUCTIONS    If unsure what causes a reaction, keep a diary of foods eaten and symptoms that follow. Avoid foods that cause reactions.   If hives or rash are present:   Take medicine as directed.   You may use an over-the-counter antihistamine (diphenhydramine) for hives and itching as needed.   Apply cold compresses (cloths) to the skin or take baths in cool water. Avoid hot baths or showers. Heat will make a rash and itching worse.   If you are severely allergic:   Following a treatment for a severe reaction, hospitalization is often required for closer follow-up.     Wear a medic-alert bracelet or necklace stating the allergy.   You and your family must learn how to give adrenaline or use an anaphylaxis kit.   If you have had a severe reaction, always carry your anaphylaxis kit or EpiPen with you. Use this medicine as directed by your caregiver if a severe reaction is occurring. Failure to do so could have a fatal outcome.  SEEK  MEDICAL CARE IF:   You suspect a food allergy. Symptoms generally happen within 30 minutes of eating a food.   Your symptoms have not gone away within 2 days or are getting worse.   You develop new symptoms.   You want to retest yourself or your child with a food or drink you think causes an allergic reaction. Never do this if an anaphylactic reaction to that food or drink has happened before. Only do this under the care of a caregiver.  SEEK IMMEDIATE MEDICAL CARE IF:    You have difficulty breathing, are wheezing, or have a tight feeling in your chest or throat.   You have a swollen mouth, or you have hives, swelling, or itching all over your body.   You have had a severe reaction that has responded to your anaphylaxis kit or an EpiPen. These reactions may return when the medicine has worn off. These reactions should be considered life threatening.  MAKE SURE YOU:    Understand these instructions.   Will watch your condition.   Will get help right away if you are not doing well or get worse.  Document Released: 09/09/2002 Document Revised: 09/08/2011 Document Reviewed: 02/14/2008  Exit[employer] Patient Information 2014 [employer]

## 2013-01-11 NOTE — Progress Notes (Signed)
Subjective:    Patient ID: Spencer Montoya, male    DOB: 09-15-1935, 77 y.o.   MRN: [id number]  HPI  Pt presents to the clinic today with c/o redness and swelling in his left eye. This started about 1 week ago. He is accompanied by his daughter who is the primary historian. She reports that he did get a Botox injection into his eyelid for blephrospasms by his neurologist. This was done at the beginning of July. Typically, after his injections, his eyes water for about a week. This time, the watering seemed to be prolonged. She denies drainage from the eye. She denies that he wakes up with eye eye crusted shut. She reports that he has not had fevers. He has no history of allergies. He has not tried treating with anything OTC.  Review of Systems      Past Medical History  Diagnosis Date  . Dysphonia   . Supranuclear palsy 01/20/2011  . Colon polyp     Colonoscopy-[facility]   . Arthritis     Back  . Progressive supranuclear palsy   . Shortness of breath   . Neuromuscular disorder   . COPD (chronic obstructive pulmonary disease)     STAGE 2    Current Outpatient Prescriptions  Medication Sig Dispense Refill  . aspirin 81 MG tablet Take 81 mg by mouth daily.        . carbidopa-levodopa (SINEMET CR) 50-200 MG per tablet Take 2 tablets by mouth 3 (three) times daily.  180 tablet  6  . Homeopathic Products ([employer] LEG CRAMPS PAIN RELIEF PO) Take 1 capsule by mouth daily.      . naproxen sodium (ANAPROX) 220 MG tablet Take 220 mg by mouth 2 (two) times daily with a meal.      . QVAR 80 MCG/ACT inhaler INHALE 1 PUFF INTO THE LUNGS TWO (2) TIMES DAILY. RINSE MOUTH AFTEREACH USE.  1 Inhaler  PRN  . salmeterol (SEREVENT DISKUS) 50 MCG/DOSE diskus inhaler       . SPIRIVA HANDIHALER 18 MCG inhalation capsule INHALE ONE CAPSULE BY MOUTH ONE TIME DAILY  30 each  PRN   No current facility-administered medications for this visit.    No Known Allergies  Family History  Problem Relation Age of  Onset  . Diabetes    . Hypertension    . Stroke Other     History   Social History  . Marital Status: Widowed    Spouse Name: N/A    Number of Children: N/A  . Years of Education: N/A   Occupational History  . 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 Smoking status: Former Smoker -- 1.00 packs/day for 50 years    Quit date: 06/30/2006  . Smokeless tobacco: Never Used  . Alcohol Use: No     Comment: rare  . Drug Use: No  . Sexually Active: Not on file   Other Topics Concern  . Not on file   Social History Narrative  . No narrative on file     Constitutional: Denies fever, malaise, fatigue, headache or abrupt weight changes.  HEENT: Pt reports eye redness. Denies eye pain, ear pain, ringing in the ears, wax buildup, runny nose, nasal congestion, bloody nose, or sore throat.  Skin: Denies redness, rashes, lesions or ulcercations.    No other specific complaints in a complete review of systems (except as listed in HPI above).  Objective:   Physical Exam  BP 90/58  Pulse 108  Temp(Src) 97 F (36.1 C) (Oral)  Wt 160 lb 1.9 oz (72.63 kg)  BMI 21.13 kg/m2  SpO2 91% Wt Readings from Last 3 Encounters:  01/11/13 160 lb 1.9 oz (72.63 kg)  12/27/12 161 lb (73.029 kg)  12/24/12 162 lb 6.4 oz (73.664 kg)    General: Appears his stated age, chronically ill appearing, in NAD. Skin: Warm, dry and intact. No rashes, lesions or ulcerations noted. HEENT: Head: normal shape and size; Eyes: sclera white, no icterus, conjunctiva pink, PERRLA and EOMs intact; Ears: Tm's gray and intact, normal light reflex; Nose: mucosa pink and moist, septum midline; Throat/Mouth: Teeth present, mucosa pink and moist, no exudate, lesions or ulcerations noted.   Cardiovascular: Normal rate and rhythm. S1,S2 noted.  No murmur, rubs or gallops noted. No JVD or BLE edema. No carotid bruits noted. Pulmonary/Chest: Normal effort and positive vesicular breath sounds. No respiratory  distress. No wheezes, rales or ronchi noted.    BMET    Component Value Date/Time   NA 135 05/04/2012 1620   K 4.0 05/04/2012 1620   CL 103 05/04/2012 1620   CO2 24 05/04/2012 1620   GLUCOSE 98 05/04/2012 1620   BUN 21 05/04/2012 1620   CREATININE 1.2 05/04/2012 1620   CALCIUM 9.1 05/04/2012 1620    Lipid Panel     Component Value Date/Time   CHOL 188 08/11/2011 1150   TRIG 79.0 08/11/2011 1150   HDL 61.50 08/11/2011 1150   CHOLHDL 3 08/11/2011 1150   VLDL 15.8 08/11/2011 1150   LDLCALC 111* 08/11/2011 1150    CBC    Component Value Date/Time   WBC 11.8* 05/04/2012 1620   RBC 4.91 05/04/2012 1620   HGB 15.1 05/04/2012 1620   HCT 46.1 05/04/2012 1620   PLT 264.0 05/04/2012 1620   MCV 93.9 05/04/2012 1620   MCHC 32.8 05/04/2012 1620   RDW 14.4 05/04/2012 1620   LYMPHSABS 2.1 05/04/2012 1620   MONOABS 1.2* 05/04/2012 1620   EOSABS 0.2 05/04/2012 1620   BASOSABS 0.0 05/04/2012 1620    Hgb A1C No results found for this basename: HGBA1C        Assessment & Plan:   Mild eye irritation secondary to allergies, new onset:  No abx as this does not appear to be infection related Can get saline eye drops and claritin OTC RTC if you notice increased redness or drainage from the eye

## 2013-01-24 ENCOUNTER — Encounter: Payer: Self-pay | Admitting: Internal Medicine

## 2013-01-24 ENCOUNTER — Ambulatory Visit (INDEPENDENT_AMBULATORY_CARE_PROVIDER_SITE_OTHER): Payer: Medicare Other | Admitting: Internal Medicine

## 2013-01-24 VITALS — BP 120/62 | HR 108 | Temp 97.0°F | Wt 164.0 lb

## 2013-01-24 DIAGNOSIS — H109 Unspecified conjunctivitis: Secondary | ICD-10-CM

## 2013-01-24 DIAGNOSIS — J449 Chronic obstructive pulmonary disease, unspecified: Secondary | ICD-10-CM

## 2013-01-24 MED ORDER — METHYLPREDNISOLONE ACETATE 80 MG/ML IJ SUSP
80.0000 mg | Freq: Once | INTRAMUSCULAR | Status: AC
  Administered 2013-01-24: 80 mg via INTRAMUSCULAR

## 2013-01-24 MED ORDER — ERYTHROMYCIN 5 MG/GM OP OINT: TOPICAL_OINTMENT | Freq: Four times a day (QID) | OPHTHALMIC | Status: DC

## 2013-01-24 NOTE — Patient Instructions (Addendum)
You had the steroid shot today Please take all new medication as prescribed - the eye ointment antibiotic Please consider using Zaditor OTC for the eyes in the future after the antibiotic if symptoms seem to try to come back  Please remember to sign up for My Chart if you have not done so, as this will be important to you in the future with finding out test results, communicating by private email, and scheduling acute appointments online when needed.  Please keep your appointments with your specialists as you have planned  Please return in 6 months, or sooner if needed

## 2013-01-24 NOTE — Progress Notes (Signed)
  Subjective:    Patient ID: Spencer Montoya, male    DOB: [birth year], 77 y.o.   MRN: [id number]  HPI  Here with 2-3 days onset bilat eye irriation, mild d/c bilat mucousy, without vision change, fever, HA, sinus congestion or pain, no ST, cough and Pt denies chest pain, increased sob or doe, wheezing, orthopnea, PND, increased LE swelling, palpitations, dizziness or syncope.   Past Medical History  Diagnosis Date  . Dysphonia   . Supranuclear palsy 01/20/2011  . Colon polyp     Colonoscopy-[facility]   . Arthritis     Back  . Progressive supranuclear palsy   . Shortness of breath   . Neuromuscular disorder   . COPD (chronic obstructive pulmonary disease)     STAGE 2   Past Surgical History  Procedure Laterality Date  . Tonsillectomy    . Cataract extraction    . Colonoscopy with propofol N/A 10/12/2012    Procedure: COLONOSCOPY WITH PROPOFOL;  Surgeon: Beverley Fiedler, MD;  Location: [facility];  Service: Gastroenterology;  Laterality: N/A;    reports that he quit smoking about 6 years ago. He has never used smokeless tobacco. He reports that he does not drink alcohol or use illicit drugs. family history includes Diabetes in an unspecified family member; Hypertension in an unspecified family member; and Stroke in his other. No Known Allergies Current Outpatient Prescriptions on File Prior to Visit  Medication Sig Dispense Refill  . aspirin 81 MG tablet Take 81 mg by mouth daily.        . carbidopa-levodopa (SINEMET CR) 50-200 MG per tablet Take 2 tablets by mouth 3 (three) times daily.  180 tablet  6  . Homeopathic Products ([employer] LEG CRAMPS PAIN RELIEF PO) Take 1 capsule by mouth daily.      . naproxen sodium (ANAPROX) 220 MG tablet Take 220 mg by mouth 2 (two) times daily with a meal.      . QVAR 80 MCG/ACT inhaler INHALE 1 PUFF INTO THE LUNGS TWO (2) TIMES DAILY. RINSE MOUTH AFTEREACH USE.  1 Inhaler  PRN  . salmeterol (SEREVENT DISKUS) 50 MCG/DOSE diskus inhaler       . SPIRIVA  HANDIHALER 18 MCG inhalation capsule INHALE ONE CAPSULE BY MOUTH ONE TIME DAILY  30 each  PRN   No current facility-administered medications on file prior to visit.   Review of Systems All otherwise neg per pt except has ongoing mild eye allergy symptoms prior     Objective:   Physical Exam BP 120/62  Pulse 108  Temp(Src) 97 F (36.1 C) (Oral)  Wt 164 lb (74.39 kg)  BMI 21.64 kg/m2  SpO2 91% VS noted,  Constitutional: Pt appears well-developed and well-nourished.  HENT: Head: NCAT.  Right Ear: External ear normal.  Left Ear: External ear normal.  Bilat conjunctiva with yellowish/green d/c Bilat tm's with mild erythema.  Max sinus areas non tender.  Pharynx with mild erythema, no exudate Eyes:  EOM are normal. Pupils are equal, round, and reactive to light.  Neck: Normal range of motion. Neck supple.  Cardiovascular: Normal rate and regular rhythm.   Pulmonary/Chest: Effort normal and breath sounds decreased, no rales or wheezing.  Abd:  Soft, NT, non-distended, + BS Neurological: Pt is alert. Not confused  Skin: Skin is warm. No erythema.  Psychiatric: Pt behavior is normal. Thought content normal.     Assessment & Plan:

## 2013-01-30 NOTE — Assessment & Plan Note (Signed)
stable overall by history and exam, recent data reviewed with pt, and pt to continue medical treatment as before,  to f/u any worsening symptoms or concerns SpO2 Readings from Last 3 Encounters:  01/24/13 91%  01/11/13 91%  12/24/12 94%

## 2013-01-30 NOTE — Assessment & Plan Note (Addendum)
Mild to mod, for antibx course adn depomedrol IM,  to f/u any worsening symptoms or concerns

## 2013-02-19 ENCOUNTER — Other Ambulatory Visit: Payer: Self-pay | Admitting: Internal Medicine

## 2013-03-14 ENCOUNTER — Ambulatory Visit
Admission: RE | Admit: 2013-03-14 | Discharge: 2013-03-14 | Disposition: A | Discharge status: Home / Self Care | Payer: Medicare Other | Source: Ambulatory Visit | Attending: Neurology | Admitting: Neurology

## 2013-03-14 ENCOUNTER — Ambulatory Visit (INDEPENDENT_AMBULATORY_CARE_PROVIDER_SITE_OTHER): Payer: Medicare Other | Admitting: Neurology

## 2013-03-14 VITALS — BP 102/60 | HR 80 | Resp 28 | Wt 161.4 lb

## 2013-03-14 DIAGNOSIS — R29818 Other symptoms and signs involving the nervous system: Secondary | ICD-10-CM

## 2013-03-14 DIAGNOSIS — R296 Repeated falls: Secondary | ICD-10-CM

## 2013-03-14 DIAGNOSIS — G238 Other specified degenerative diseases of basal ganglia: Secondary | ICD-10-CM

## 2013-03-14 DIAGNOSIS — G231 Progressive supranuclear ophthalmoplegia [Steele-Richardson-Olszewski]: Secondary | ICD-10-CM

## 2013-03-14 LAB — CBC WITH DIFFERENTIAL/PLATELET
Eosinophils Relative: 0.4 % (ref 0.0–5.0)
HCT: 41.8 % (ref 39.0–52.0)
Hemoglobin: 14.1 g/dL (ref 13.0–17.0)
Lymphs Abs: 0.1 10*3/uL — ABNORMAL LOW (ref 0.7–4.0)
MCV: 91.2 fl (ref 78.0–100.0)
Monocytes Absolute: 0.5 10*3/uL (ref 0.1–1.0)
Neutro Abs: 15.1 10*3/uL — ABNORMAL HIGH (ref 1.4–7.7)
Platelets: 232 10*3/uL (ref 150.0–400.0)
RDW: 14.6 % (ref 11.5–14.6)
WBC: 15.8 10*3/uL — ABNORMAL HIGH (ref 4.5–10.5)

## 2013-03-14 MED ORDER — CARBIDOPA-LEVODOPA 25-100 MG PO TABS: 4.0000 | ORAL_TABLET | Freq: Every morning | ORAL | Status: AC

## 2013-03-14 NOTE — Patient Instructions (Addendum)
1.  Change your morning dosage of the carbidopa/levodopa to the 25/100 - 4 tablets in the AM 2.  You should not be walking 3.  Go downstairs to [facility] right now for your CT head.

## 2013-03-14 NOTE — Progress Notes (Signed)
Spencer Montoya was seen today in the movement disorders clinic for neurologic f/u.  The patient was previously seen by Dr. Modesto Montoya but since he is no longer with the practice I am resuming the patient's care.  I reviewed Dr. Nash Montoya prior notes.  This patient is accompanied in the office by his daughter Spencer Montoya) who supplements the history.    The consultation is for the evaluation of PSP.  The patient was diagnosed with progressive supranuclear palsy about 4 years ago in [place] after multiple falls.   He still has falls up to multiple times per day, and no falls on rare days.  His last significant fall was in May after hitting his head.  He had stitches in the head.  Last November he broke ribs after a fall.   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 Modesto Montoya recommended that the patient not drive.  Unfortunately, the patient has not followed that recommendation.  The patient currently lives with his daughter who recently fell at home that was modified to make it handicap accessible.  His daughter does work outside the home.  There is no other help available, but the patient and his daughter do not think that he needs further help.  The patient is currently on carbidopa/levodopa CR 50/200, 2 tablets 3 times a day.  03/14/13 update:  The patient is following up today, accompanied by his daughter who supplements the history.  The patient has progressive supranuclear palsy.  He has had several injections of Botox for eyelid opening apraxia, which has worked well.  With the last series, on June 30, the patient's daughter called and said that he had some type of drainage from his eye.  He was seen by his primary care physician and I reviewed those notes.  It look like the patient has had a conjunctivitis, unrelated to the Botox.  He remains on carbidopa/levodopa 50/200, 2 tablets 3 times a day for a total daily levodopa dosage of 1200 mg per day.  He presents today because of continued falls.  In the  past, it is my recommendation that he not walk at all because of falling.  His daughter called the pt thisAM and he would not answer the door.  It turns out the neighbor found him on the floor.  He feel and hit his side.  He did not hit his head.  His daughter went back to work.  30 mins later, the pt fell over the tub and hit his head.  He did not lose consciousness.  He was able to push his medical alert button.   He has seemed more shaky since then (although his daughter states that is improving some).  He always falls, virtually daily.  He is willing to look into some home care and has LTC insurance.  He is willing to look at lift chairs now (was not willing in the past).  His eyelids are doing pretty well staying open.   PREVIOUS MEDICATIONS: Sinemet CR  ALLERGIES:  No Known Allergies  CURRENT MEDICATIONS:  Current Outpatient Prescriptions on File Prior to Visit  Medication Sig Dispense Refill  . aspirin 81 MG tablet Take 81 mg by mouth daily.        . carbidopa-levodopa (SINEMET CR) 50-200 MG per tablet Take 2 tablets by mouth 3 (three) times daily.  180 tablet  6  . erythromycin ophthalmic ointment Place into both eyes every 6 (six) hours. For 10 days  3.5 g  1  . Homeopathic  Products ([employer]S LEG CRAMPS PAIN RELIEF PO) Take 1 capsule by mouth daily.      . naproxen sodium (ANAPROX) 220 MG tablet Take 220 mg by mouth 2 (two) times daily with a meal.      . QVAR 80 MCG/ACT inhaler Use 1 puff 2 times daily. Rinse mouth after each use  8.7 g  PRN  . SEREVENT DISKUS 50 MCG/DOSE diskus inhaler Inhale one puff by mouth twice daily  1 Inhaler  PRN  . SPIRIVA HANDIHALER 18 MCG inhalation capsule Inhale one capsule by mouth one time daily  30 capsule  PRN   No current facility-administered medications on file prior to visit.    PAST MEDICAL HISTORY:   Past Medical History  Diagnosis Date  . Dysphonia   . Supranuclear palsy 01/20/2011  . Colon polyp     Colonoscopy-[facility]   . Arthritis      Back  . Progressive supranuclear palsy   . Shortness of breath   . Neuromuscular disorder   . COPD (chronic obstructive pulmonary disease)     STAGE 2    PAST SURGICAL HISTORY:   Past Surgical History  Procedure Laterality Date  . Tonsillectomy    . Cataract extraction    . Colonoscopy with propofol N/A 10/12/2012    Procedure: COLONOSCOPY WITH PROPOFOL;  Surgeon: Beverley Fiedler, MD;  Location: [facility];  Service: Gastroenterology;  Laterality: N/A;    SOCIAL HISTORY:   History   Social History  . Marital Status: Widowed    Spouse Name: N/A    Number of Children: N/A  . Years of Education: N/A   Occupational History  . 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 Smoking status: Former Smoker -- 1.00 packs/day for 50 years    Quit date: 06/30/2006  . Smokeless tobacco: Never Used  . Alcohol Use: No     Comment: rare  . Drug Use: No  . Sexual Activity: Not on file   Other Topics Concern  . Not on file   Social History Narrative  . No narrative on file    FAMILY HISTORY:   Family Status  Relation Status Death Age  . Mother Deceased     pneumonia  . Father Deceased     CVA  . Sister Alive     healthy  . Child Alive     2, alive and well    ROS:  A complete 10 system review of systems was obtained and was unremarkable apart from what is mentioned above.  PHYSICAL EXAMINATION:    VITALS:   There were no vitals filed for this visit.  GEN:  The patient appears stated age and is in NAD. HEENT:  Normocephalic, atraumatic.  The mucous membranes are moist. The superficial temporal arteries are without ropiness or tenderness. CV:  RRR Lungs:  CTAB Neck/HEME:  There are no carotid bruits bilaterally.  Neurological examination:  Orientation: The patient is alert and oriented x3. Fund of knowledge is appropriate.  Recent and remote memory are intact.  Attention and concentration are normal.    Able to name objects and repeat  phrases. Cranial nerves: There is good facial symmetry. There is facial hypomimia.  The eyelids are held open, but the minute that he is asked to close the minute he needs to blink, the eyelids remain closed and he will have to manually open them.  Pupils are equal round and reactive to light bilaterally.  There are square wave jerks.   Fundoscopic exam reveals clear margins bilaterally. Extraocular muscles are intact. The visual fields are full to confrontational testing. The speech is fluent but very hypophonic (whisper only). Soft palate rises symmetrically and there is no tongue deviation. Hearing is intact to conversational tone. Sensation: Sensation is intact to light and pinprick throughout (facial, trunk, extremities). Vibration is intact at the bilateral big toe. There is no extinction with double simultaneous stimulation. There is no sensory dermatomal level identified. Motor: Strength is 5/5 in the bilateral upper and lower extremities.   Shoulder shrug is equal and symmetric.  There is no pronator drift. Deep tendon reflexes: Deep tendon reflexes are 2/4 at the bilateral biceps, triceps, brachioradialis, patella and achilles. Plantar responses are downgoing bilaterally.  Movement examination: Tone: There is just slightly increased tone in the left upper extremity.  Tone in the right upper extremity is normal.  Tone in the lower extremities is normal.  Abnormal movements: None. Coordination:  There is minimally decreased decremation with RAM's, just noted with heel taps and toe taps bilaterally. Gait and Station: The patient has significant difficulty arising out of a deep-seated chair without the use of the hands.  He has significant retropulsion.  He has a stooped posture.  He is very unstable but did not freeze today.  He has a festinating gait.    LABS:    Chemistry      Component Value Date/Time   NA 135 05/04/2012 1620   K 4.0 05/04/2012 1620   CL 103 05/04/2012 1620   CO2 24  05/04/2012 1620   BUN 21 05/04/2012 1620   CREATININE 1.2 05/04/2012 1620      Component Value Date/Time   CALCIUM 9.1 05/04/2012 1620   ALKPHOS 63 05/04/2012 1620   AST 15 05/04/2012 1620   ALT 4 05/04/2012 1620   BILITOT 0.7 05/04/2012 1620       Lab Results  Component Value Date   WBC 11.8* 05/04/2012   HGB 15.1 05/04/2012   HCT 46.1 05/04/2012   MCV 93.9 05/04/2012   PLT 264.0 05/04/2012   Lab Results  Component Value Date   TSH 1.68 05/04/2012   No results found for this basename: VITAMINB12     ASSESSMENT:   1.  Progressive supranuclear palsy.  I agree with the diagnosis.  The patient's biggest problems at this point in time are falls and eyelid opening apraxia/blepharospasm.  He is at significant risk for fracture and aspiration.  PLAN:   1.  I am going to do an CT of the brain since the patient hit his head this morning. 2. he will have a CBC, PT/PTT/INR 3.   I talked to the patient and his daughter about safety.  Greater than 50% of the 80 minute visit was spent in counseling in this regard.  We talked about the fact that I don't think that he should be walking at all.  I think that he should be in a wheelchair at all times.  In addition, like Dr. Modesto Montoya, I am concerned about his driving and ability to do that, especially with the inability to hold the eyes open.  They understand our position regarding driving. 4.  I am going to send a referral for home instead. 5.  I am not sure if the levodopa is helping, but I am going to change the first morning dosage to the regular formulation.  It is unclear why he has been on the extended release formulation  since the beginning.  He does admit that he had not taken the levodopa prior to the fall this morning, but generally it does not seem to matter. 6.  he has an appointment with me in 2 weeks for the Botox for the blepharospasm/eyelid opening apraxia and he will let me know how he is doing with the new levodopa at that time.

## 2013-03-15 ENCOUNTER — Emergency Department (HOSPITAL_COMMUNITY)
Admission: EM | Admit: 2013-03-15 | Discharge: 2013-03-15 | Disposition: A | Discharge status: Home / Self Care | Payer: Medicare Other | Attending: Emergency Medicine | Admitting: Emergency Medicine

## 2013-03-15 ENCOUNTER — Telehealth: Payer: Self-pay | Admitting: Neurology

## 2013-03-15 ENCOUNTER — Emergency Department (HOSPITAL_COMMUNITY): Payer: Medicare Other

## 2013-03-15 ENCOUNTER — Encounter (HOSPITAL_COMMUNITY): Payer: Self-pay | Admitting: Emergency Medicine

## 2013-03-15 DIAGNOSIS — R296 Repeated falls: Secondary | ICD-10-CM

## 2013-03-15 DIAGNOSIS — Z792 Long term (current) use of antibiotics: Secondary | ICD-10-CM | POA: Insufficient documentation

## 2013-03-15 DIAGNOSIS — S0990XA Unspecified injury of head, initial encounter: Secondary | ICD-10-CM | POA: Insufficient documentation

## 2013-03-15 DIAGNOSIS — M479 Spondylosis, unspecified: Secondary | ICD-10-CM | POA: Insufficient documentation

## 2013-03-15 DIAGNOSIS — Y9289 Other specified places as the place of occurrence of the external cause: Secondary | ICD-10-CM | POA: Insufficient documentation

## 2013-03-15 DIAGNOSIS — W19XXXA Unspecified fall, initial encounter: Secondary | ICD-10-CM

## 2013-03-15 DIAGNOSIS — J449 Chronic obstructive pulmonary disease, unspecified: Secondary | ICD-10-CM | POA: Insufficient documentation

## 2013-03-15 DIAGNOSIS — S0181XA Laceration without foreign body of other part of head, initial encounter: Secondary | ICD-10-CM

## 2013-03-15 DIAGNOSIS — Z860100 Personal history of colon polyps, unspecified: Secondary | ICD-10-CM | POA: Insufficient documentation

## 2013-03-15 DIAGNOSIS — Y9389 Activity, other specified: Secondary | ICD-10-CM | POA: Insufficient documentation

## 2013-03-15 DIAGNOSIS — W010XXA Fall on same level from slipping, tripping and stumbling without subsequent striking against object, initial encounter: Secondary | ICD-10-CM | POA: Insufficient documentation

## 2013-03-15 DIAGNOSIS — Z79899 Other long term (current) drug therapy: Secondary | ICD-10-CM | POA: Insufficient documentation

## 2013-03-15 DIAGNOSIS — Z87891 Personal history of nicotine dependence: Secondary | ICD-10-CM | POA: Insufficient documentation

## 2013-03-15 DIAGNOSIS — Z791 Long term (current) use of non-steroidal anti-inflammatories (NSAID): Secondary | ICD-10-CM | POA: Insufficient documentation

## 2013-03-15 DIAGNOSIS — S60512A Abrasion of left hand, initial encounter: Secondary | ICD-10-CM

## 2013-03-15 DIAGNOSIS — Z7982 Long term (current) use of aspirin: Secondary | ICD-10-CM | POA: Insufficient documentation

## 2013-03-15 DIAGNOSIS — S0180XA Unspecified open wound of other part of head, initial encounter: Secondary | ICD-10-CM | POA: Insufficient documentation

## 2013-03-15 DIAGNOSIS — IMO0002 Reserved for concepts with insufficient information to code with codable children: Secondary | ICD-10-CM | POA: Insufficient documentation

## 2013-03-15 DIAGNOSIS — Z8669 Personal history of other diseases of the nervous system and sense organs: Secondary | ICD-10-CM | POA: Insufficient documentation

## 2013-03-15 DIAGNOSIS — J4489 Other specified chronic obstructive pulmonary disease: Secondary | ICD-10-CM | POA: Insufficient documentation

## 2013-03-15 LAB — COMPREHENSIVE METABOLIC PANEL
ALT: 7 U/L (ref 0–53)
Albumin: 3.5 g/dL (ref 3.5–5.2)
Alkaline Phosphatase: 78 U/L (ref 39–117)
Chloride: 100 mEq/L (ref 96–112)
Potassium: 4.5 mEq/L (ref 3.5–5.1)
Sodium: 134 mEq/L — ABNORMAL LOW (ref 135–145)
Total Protein: 6.8 g/dL (ref 6.0–8.3)

## 2013-03-15 LAB — URINALYSIS W MICROSCOPIC + REFLEX CULTURE
Glucose, UA: NEGATIVE mg/dL
Hgb urine dipstick: NEGATIVE
Specific Gravity, Urine: 1.03 (ref 1.005–1.030)
pH: 5.5 (ref 5.0–8.0)

## 2013-03-15 LAB — CBC WITH DIFFERENTIAL/PLATELET
Basophils Relative: 0 % (ref 0–1)
Eosinophils Absolute: 0 10*3/uL (ref 0.0–0.7)
Lymphs Abs: 0.3 10*3/uL — ABNORMAL LOW (ref 0.7–4.0)
MCH: 30.8 pg (ref 26.0–34.0)
MCHC: 33.5 g/dL (ref 30.0–36.0)
Neutro Abs: 6.5 10*3/uL (ref 1.7–7.7)
Neutrophils Relative %: 89 % — ABNORMAL HIGH (ref 43–77)
Platelets: 212 10*3/uL (ref 150–400)
RBC: 4.38 MIL/uL (ref 4.22–5.81)

## 2013-03-15 MED ORDER — ALBUTEROL (5 MG/ML) CONTINUOUS INHALATION SOLN
2.5000 mg/h | INHALATION_SOLUTION | RESPIRATORY_TRACT | Status: DC
  Administered 2013-03-15: 2.5 mg/h via RESPIRATORY_TRACT
  Filled 2013-03-15: qty 60

## 2013-03-15 MED ORDER — SODIUM CHLORIDE 0.9 % IV BOLUS (SEPSIS)
500.0000 mL | Freq: Once | INTRAVENOUS | Status: AC
  Administered 2013-03-15: 500 mL via INTRAVENOUS

## 2013-03-15 MED ORDER — METHYLPREDNISOLONE SODIUM SUCC 125 MG IJ SOLR
125.0000 mg | Freq: Once | INTRAMUSCULAR | Status: AC
  Administered 2013-03-15: 125 mg via INTRAVENOUS
  Filled 2013-03-15: qty 2

## 2013-03-15 MED ORDER — CVS TRIPLE ANTIBIOTIC EX OINT: 1.0000 "application " | TOPICAL_OINTMENT | Freq: Two times a day (BID) | CUTANEOUS | Status: DC

## 2013-03-15 NOTE — Telephone Encounter (Signed)
Left message for [name], pt's daughter, to call back for lab results.

## 2013-03-15 NOTE — ED Provider Notes (Signed)
LACERATION REPAIR Performed by: Lottie Mussel Authorized by: Jaynie Crumble A Consent: Verbal consent obtained. Risks and benefits: risks, benefits and alternatives were discussed Consent given by: patient Patient identity confirmed: provided demographic data Prepped and Draped in normal sterile fashion Wound explored  Laceration Location: left eyebrow  Laceration Length: 4cm  No Foreign Bodies seen or palpated  Anesthesia: local infiltration  Local anesthetic: lidocaine 2% w epinephrine  Anesthetic total: 3 ml  Irrigation method: syringe Amount of cleaning: standard  Skin closure: prolene 6.0  Number of sutures: 7  Technique: simple interrupted  Patient tolerance: Patient tolerated the procedure well with no immediate complications.   Lottie Mussel, PA-C 03/15/13 1940

## 2013-03-15 NOTE — ED Provider Notes (Signed)
CSN: [id number]     Arrival date & time 03/15/13  1531 History   First MD Initiated Contact with Patient 03/15/13 1546     Chief Complaint  Patient presents with  . Fall   (Consider location/radiation/quality/duration/timing/severity/associated sxs/prior Treatment) Patient is a 77 y.o. male presenting with fall. The history is provided by the patient and a caregiver.  Fall This is a recurrent problem. Episode onset: 2-3 hours ago. The problem occurs daily. The problem has not changed since onset.Pertinent negatives include no chest pain, no abdominal pain, no headaches and no shortness of breath. The symptoms are aggravated by walking. Nothing relieves the symptoms. He has tried nothing for the symptoms. The treatment provided no relief.    Past Medical History  Diagnosis Date  . Dysphonia   . Supranuclear palsy 01/20/2011  . Colon polyp     Colonoscopy-[facility]   . Arthritis     Back  . Progressive supranuclear palsy   . Shortness of breath   . Neuromuscular disorder   . COPD (chronic obstructive pulmonary disease)     STAGE 2   Past Surgical History  Procedure Laterality Date  . Tonsillectomy    . Cataract extraction    . Colonoscopy with propofol N/A 10/12/2012    Procedure: COLONOSCOPY WITH PROPOFOL;  Surgeon: Beverley Fiedler, MD;  Location: [facility];  Service: Gastroenterology;  Laterality: N/A;   Family History  Problem Relation Age of Onset  . Diabetes    . Hypertension    . Stroke Other    History  Substance Use Topics  . Smoking status: Former Smoker -- 1.00 packs/day for 50 years    Quit date: 06/30/2006  . Smokeless tobacco: Never Used  . Alcohol Use: No     Comment: rare    Review of Systems  Constitutional: Negative for fever.  HENT: Negative for rhinorrhea, drooling and neck pain.   Eyes: Negative for pain.  Respiratory: Negative for cough and shortness of breath.   Cardiovascular: Negative for chest pain and leg swelling.  Gastrointestinal:  Negative for nausea, vomiting, abdominal pain and diarrhea.  Genitourinary: Negative for dysuria and hematuria.  Musculoskeletal: Negative for gait problem.  Skin: Negative for color change.  Neurological: Negative for numbness and headaches.  Hematological: Negative for adenopathy.  Psychiatric/Behavioral: Negative for behavioral problems.  All other systems reviewed and are negative.    Allergies  Review of patient's allergies indicates no known allergies.  Home Medications   Current Outpatient Rx  Name  Route  Sig  Dispense  Refill  . aspirin 81 MG tablet   Oral   Take 81 mg by mouth daily.           . carbidopa-levodopa (SINEMET CR) 50-200 MG per tablet   Oral   Take 2 tablets by mouth as directed. Takes at 1400 and 2000         . carbidopa-levodopa (SINEMET IR) 25-100 MG per tablet   Oral   Take 4 tablets by mouth every morning.   120 tablet   5   . Homeopathic Products ([employer] LEG CRAMPS PAIN RELIEF PO)   Oral   Take 1 capsule by mouth daily.         Marland Kitchen ketotifen (ZADITOR) 0.025 % ophthalmic solution   Both Eyes   Place 2 drops into both eyes 2 (two) times daily as needed (itchy eyes/allergies).         . naproxen sodium (ANAPROX) 220 MG tablet   Oral  Take 220 mg by mouth 2 (two) times daily with a meal.         . QVAR 80 MCG/ACT inhaler      Use 1 puff 2 times daily. Rinse mouth after each use   8.7 g   PRN   . salmeterol (SEREVENT DISKUS) 50 MCG/DOSE diskus inhaler               . SPIRIVA HANDIHALER 18 MCG inhalation capsule      Inhale one capsule by mouth one time daily   30 capsule   PRN   . sulfamethoxazole-trimethoprim (BACTRIM DS) 800-160 MG per tablet   Oral   Take 1 tablet by mouth 2 (two) times daily.          BP 81/38  Pulse 86  Temp(Src) 98.3 F (36.8 C) (Oral)  Resp 22  SpO2 90% Physical Exam  Nursing note and vitals reviewed. Constitutional: He appears well-developed and well-nourished.  HENT:  Head:  Normocephalic and atraumatic.    Right Ear: External ear normal.  Left Ear: External ear normal.  Nose: Nose normal.  Mouth/Throat: Oropharynx is clear and moist. No oropharyngeal exudate.  Eyes: Conjunctivae and EOM are normal. Pupils are equal, round, and reactive to light.  Neck: Normal range of motion. Neck supple.  Cardiovascular: Normal rate, regular rhythm, normal heart sounds and intact distal pulses.  Exam reveals no gallop and no friction rub.   No murmur heard. Pulmonary/Chest: Effort normal. No respiratory distress. He has wheezes (diffusely). He exhibits no tenderness.  Abdominal: Soft. Bowel sounds are normal. He exhibits no distension. There is no tenderness. There is no rebound and no guarding.  Musculoskeletal: Normal range of motion. He exhibits no edema and no tenderness.  Multiple abrasions to left hand w/ mild dehiscence of superficial tissues. No focal ttp of left hand.   Evidence of recent I/D of distal dorsal ring finger on right hand. No evidence of ongoing infection noted. No erythema or continued drainage.   No focal pelvic or hip ttp. Normal rom of hips.   Neurological: He is alert. He has normal strength. No sensory deficit.  Aphasic at baseline. Follows commands easily. No obvious CN deficit. Pt able to support his own weight with standing, but becomes hypoxic w/ O2 sat of 87%.   Skin: Skin is warm and dry.  Psychiatric: He has a normal mood and affect. His behavior is normal.    ED Course  Procedures (including critical care time) Labs Review Labs Reviewed  CBC WITH DIFFERENTIAL - Abnormal; Notable for the following:    Neutrophils Relative % 89 (*)    Lymphocytes Relative 4 (*)    Lymphs Abs 0.3 (*)    All other components within normal limits  COMPREHENSIVE METABOLIC PANEL - Abnormal; Notable for the following:    Sodium 134 (*)    BUN 29 (*)    Creatinine, Ser 1.55 (*)    GFR calc non Af Amer 42 (*)    GFR calc Af Amer 49 (*)    All other  components within normal limits  PRO B NATRIURETIC PEPTIDE - Abnormal; Notable for the following:    Pro B Natriuretic peptide (BNP) 258.7 (*)    All other components within normal limits  URINALYSIS W MICROSCOPIC + REFLEX CULTURE - Abnormal; Notable for the following:    Color, Urine AMBER (*)    Crystals CYSTINE CRYSTALS PRESENT (*)    All other components within normal limits  URINE CULTURE  TROPONIN I   Imaging Review Ct Head Wo Contrast  03/14/2013   CLINICAL DATA:  Recent falls, fall this a.m.  EXAM: CT HEAD WITHOUT CONTRAST  TECHNIQUE: Contiguous axial images were obtained from the base of the skull through the vertex without intravenous contrast.  COMPARISON:  The head CT 11/04/2011  FINDINGS: No intracranial hemorrhage. No parenchymal contusion. No midline shift or mass effect. Basilar cisterns are patent. No skull base fracture. No fluid in the paranasal sinuses or mastoid air cells. Mild atrophy and microvascular disease. Mild prominence of the CSF spaces anteriorly likely related to atrophy.  IMPRESSION: No acute intracranial trauma. Chronic atrophy and microvascular disease.   Electronically Signed   By: Genevive Bi M.D.   On: 03/14/2013 15:28    Date: 03/15/2013  Rate: 85  Rhythm: normal sinus rhythm  QRS Axis: normal  Intervals: normal  ST/T Wave abnormalities: normal  Conduction Disutrbances:none  Narrative Interpretation: No new ST or T wave changes consistent with ischemia.    Old EKG Reviewed: unchanged   MDM   1. Fall, initial encounter   2. Laceration of face, initial encounter   3. Abrasion of left hand, initial encounter    4:19 PM 77 y.o. male with a history of supranuclear palsy who presents with mechanical fall which occurred in his driveway several hours ago. The patient did hit his head but he denies losing consciousness. He currently denies any pain. He lives with his daughter. She notes that the patient had increased weakness in his legs yesterday  and was seen by his neurologist Dr. Arbutus Leas. The patient notes that he has much better strength in his lower extremities today. He is afebrile and has soft blood pressures here ( sys 80's-90's) which his daughter notes he has a history of. Will get labs, IV fluid, breathing treatments as the patient becomes hypoxic with standing and CT of head.  Tdap UTD per daughter.   8:19 PM: Lac repaired by PA. I interpreted/reviewed the labs and/or imaging which were non-contributory. Pt continues to appear well and no longer wheezing after the breathing tx. The daughter notes that he typically becomes sob w/ ambulation. He is stable on his home O2 at rest. The pt would like to go home and w/ no change on CXR, I think this is reasonable as this is probably close to his baseline w/ the daughter noting he typically is sob w/ ambulation. I warned them that he should only be ambulating short distances within the house. They understand.  I have discussed the diagnosis/risks/treatment options with the family and believe the pt to be eligible for discharge home to follow-up with his pcp in 5 days for suture removal. We also discussed returning to the ED immediately if new or worsening sx occur. We discussed the sx which are most concerning (e.g., continued falls, HA, pain, fever, sob) that necessitate immediate return. Any new prescriptions provided to the patient are listed below.  Discharge Medication List as of 03/15/2013  8:23 PM    START taking these medications   Details  Neomycin-Bacitracin-Polymyxin ([employer] TRIPLE ANTIBIOTIC) OINT Apply 1 application topically 2 (two) times daily., Starting 03/15/2013, Until Discontinued, Print         Junius Argyle, MD 03/16/13 [id number]

## 2013-03-15 NOTE — ED Notes (Signed)
Bed: WA19 Expected date:  Expected time:  Means of arrival:  Comments: ems 

## 2013-03-15 NOTE — ED Notes (Signed)
Patient tripped in his driveway with no LOC.  No neck or back pain but does have a small laceration to left forehead and skin tear to left hand.  Patient is alert and oriented.  Patient cannot speak due to neurological disorder, but is able to follow commands and interact with EMS.  Daughter reports patient has been falling more in the last couple of weeks.  Patient is recovering from finger infection on right 4th finger.  He is currently taking Septra for that infection and started it on Sunday.

## 2013-03-16 NOTE — ED Provider Notes (Signed)
Medical screening examination/treatment/procedure(s) were conducted as a shared visit with non-physician practitioner(s) and myself.  I personally evaluated the patient during the encounter  I performed the primary evaluation of this patient. The PA helped me by performing the laceration repair.   Junius Argyle, MD 03/16/13 [id number]

## 2013-03-17 LAB — URINE CULTURE
Colony Count: NO GROWTH
Culture: NO GROWTH

## 2013-03-22 ENCOUNTER — Ambulatory Visit (INDEPENDENT_AMBULATORY_CARE_PROVIDER_SITE_OTHER): Payer: Medicare Other | Admitting: Internal Medicine

## 2013-03-22 ENCOUNTER — Encounter: Payer: Self-pay | Admitting: Internal Medicine

## 2013-03-22 VITALS — BP 110/60 | HR 102 | Temp 98.1°F | Wt 167.0 lb

## 2013-03-22 DIAGNOSIS — Z9181 History of falling: Secondary | ICD-10-CM

## 2013-03-22 DIAGNOSIS — R296 Repeated falls: Secondary | ICD-10-CM

## 2013-03-22 DIAGNOSIS — Z5189 Encounter for other specified aftercare: Secondary | ICD-10-CM

## 2013-03-22 DIAGNOSIS — J449 Chronic obstructive pulmonary disease, unspecified: Secondary | ICD-10-CM

## 2013-03-22 DIAGNOSIS — Z23 Encounter for immunization: Secondary | ICD-10-CM

## 2013-03-22 DIAGNOSIS — S0181XD Laceration without foreign body of other part of head, subsequent encounter: Secondary | ICD-10-CM

## 2013-03-22 NOTE — Assessment & Plan Note (Signed)
No sign cellulitis, ok for stitches out, no other tx at this time

## 2013-03-22 NOTE — Patient Instructions (Addendum)
You had the flu shot today Your stitches were removed today You should be contacted by a representative from [facility] ([employer] [facility]) to see if any further needs are found Please continue all other medications as before, and refills have been done if requested. Please have the pharmacy call with any other refills you may need  Please remember to sign up for My Chart if you have not done so, as this will be important to you in the future with finding out test results, communicating by private email, and scheduling acute appointments online when needed.

## 2013-03-22 NOTE — Progress Notes (Signed)
Subjective:    Patient ID: Spencer Montoya, male    DOB: [birth year], 77 y.o.   MRN: [id number]  HPI   Here after fall while trying to walk in the concrete driveway without his walker, to check the sprinkler system.  Unfortunately became off balance, coulndt stop and fell forward to left forehead on the concrete, with laceration requiring 7 stitches in the ER.  No LOC or other symptoms.  CT head neg for acute.  Routine labs neg for acute, including urine cx.  Pt denies chest pain, increased sob or doe, wheezing, orthopnea, PND, increased LE swelling, palpitations. No syncope. Daughter remains quite supportive, seeing neuro regularly for PD.  Has fallen several time, but has much less chance of doing this when walking with walker.  For flu shot today.  Needs stitches out.  No other new complaints.  Is not involved with [facility] yet Past Medical History  Diagnosis Date  . Dysphonia   . Supranuclear palsy 01/20/2011  . Colon polyp     Colonoscopy-[facility]   . Arthritis     Back  . Progressive supranuclear palsy   . Shortness of breath   . Neuromuscular disorder   . COPD (chronic obstructive pulmonary disease)     STAGE 2   Past Surgical History  Procedure Laterality Date  . Tonsillectomy    . Cataract extraction    . Colonoscopy with propofol N/A 10/12/2012    Procedure: COLONOSCOPY WITH PROPOFOL;  Surgeon: Beverley Fiedler, MD;  Location: [facility];  Service: Gastroenterology;  Laterality: N/A;    reports that he quit smoking about 6 years ago. He has never used smokeless tobacco. He reports that he does not drink alcohol or use illicit drugs. family history includes Diabetes in an other family member; Hypertension in an other family member; Stroke in his other. No Known Allergies Current Outpatient Prescriptions on File Prior to Visit  Medication Sig Dispense Refill  . aspirin 81 MG tablet Take 81 mg by mouth daily.        . carbidopa-levodopa (SINEMET CR) 50-200 MG per tablet Take 2  tablets by mouth as directed. Takes at 1400 and 2000      . carbidopa-levodopa (SINEMET IR) 25-100 MG per tablet Take 4 tablets by mouth every morning.  120 tablet  5  . Homeopathic Products ([employer] LEG CRAMPS PAIN RELIEF PO) Take 1 capsule by mouth daily.      Marland Kitchen ketotifen (ZADITOR) 0.025 % ophthalmic solution Place 2 drops into both eyes 2 (two) times daily as needed (itchy eyes/allergies).      . naproxen sodium (ANAPROX) 220 MG tablet Take 220 mg by mouth 2 (two) times daily with a meal.      . Neomycin-Bacitracin-Polymyxin ([employer]S TRIPLE ANTIBIOTIC) OINT Apply 1 application topically 2 (two) times daily.  60 g  0  . QVAR 80 MCG/ACT inhaler Use 1 puff 2 times daily. Rinse mouth after each use  8.7 g  PRN  . salmeterol (SEREVENT DISKUS) 50 MCG/DOSE diskus inhaler       . SPIRIVA HANDIHALER 18 MCG inhalation capsule Inhale one capsule by mouth one time daily  30 capsule  PRN   No current facility-administered medications on file prior to visit.   Review of Systems  Constitutional: Negative for unexpected weight change, or unusual diaphoresis  HENT: Negative for tinnitus.   Eyes: Negative for photophobia and visual disturbance.  Respiratory: Negative for choking and stridor.   Gastrointestinal: Negative for vomiting and blood in  stool.  Genitourinary: Negative for hematuria and decreased urine volume.  Musculoskeletal: Negative for acute joint swelling Skin: Negative for color change and wound.  Neurological: Negative for tremors and numbness other than noted  Psychiatric/Behavioral: Negative for decreased concentration or  hyperactivity.       Objective:   Physical Exam BP 110/60  Pulse 102  Temp(Src) 98.1 F (36.7 C) (Oral)  Wt 167 lb (75.751 kg)  BMI 22.04 kg/m2  SpO2 92% VS noted, frail appearing Constitutional: Pt appears well-developed and well-nourished.  HENT: Head: NCAT.  Right Ear: External ear normal.  Left Ear: External ear normal.  Eyes: Conjunctivae and EOM are normal.  Pupils are equal, round, and reactive to light.  Neck: Normal range of motion. Neck supple.  Cardiovascular: Normal rate and regular rhythm.   Pulmonary/Chest: Effort normal and breath sounds normal.  - no rales or wheezing Neurological: Pt is alert. Not confused  Skin: Skin is warm. No erythema. left forehead laceration intact, no sign cellultis, 7 stitch removed per CMA Psychiatric: Pt behavior is normal. Thought content normal.     Assessment & Plan:

## 2013-03-22 NOTE — Assessment & Plan Note (Signed)
stable overall by history and exam, recent data reviewed with pt, and pt to continue medical treatment as before,  to f/u any worsening symptoms or concerns SpO2 Readings from Last 3 Encounters:  03/22/13 92%  03/15/13 90%  01/24/13 91%

## 2013-03-22 NOTE — Assessment & Plan Note (Signed)
Mostly related to PD, advised to walk with walker at all times, also for [facility] referral to assess for other needs or support, daughter will need to be the contact as he cannot speak on phone

## 2013-03-29 ENCOUNTER — Ambulatory Visit (INDEPENDENT_AMBULATORY_CARE_PROVIDER_SITE_OTHER): Payer: Medicare Other | Admitting: Neurology

## 2013-03-29 VITALS — BP 110/62 | HR 70 | Temp 98.1°F | Resp 20

## 2013-03-29 DIAGNOSIS — G238 Other specified degenerative diseases of basal ganglia: Secondary | ICD-10-CM

## 2013-03-29 DIAGNOSIS — G231 Progressive supranuclear ophthalmoplegia [Steele-Richardson-Olszewski]: Secondary | ICD-10-CM

## 2013-03-29 DIAGNOSIS — R260 Ataxic gait: Secondary | ICD-10-CM

## 2013-03-29 DIAGNOSIS — R269 Unspecified abnormalities of gait and mobility: Secondary | ICD-10-CM

## 2013-03-29 DIAGNOSIS — G245 Blepharospasm: Secondary | ICD-10-CM

## 2013-03-29 MED ORDER — ONABOTULINUMTOXINA 100 UNITS IJ SOLR
100.0000 [IU] | Freq: Once | INTRAMUSCULAR | Status: AC
  Administered 2013-03-29: 100 [IU] via INTRAMUSCULAR

## 2013-03-29 NOTE — Progress Notes (Signed)
Pt here for procedure.  See procedural note.

## 2013-03-29 NOTE — Procedures (Signed)
[facility]   Procedure Note Botox  Attending: Dr. Lurena Joiner Tat  Preoperative Diagnosis(es): Blepharospasm   Clinical history:  The patient did great with his last botox.  For the first time in years, he had sustained opening of the lids.  He noted some wearing off a few weeks ago but it lasted much longer than the one previously.  It has not yet completely worn off (not even close to baseline per daughter) but it has worn off enough that he has to pull the lids open 2-3 times per day now.  There were no SE with last injection.   Consent obtained from: The patient  Benefits discussed included, but were not limited to ability to more easily open eyes and therefore see better.  Risk discussed included, but were not limited pain and discomfort, bleeding, bruising, excessive weakness, venous thrombosis, muscle atrophy and drooping of eyelids.  A copy of the patient medication guide was given to the patient which explains the blackbox warning.  Patients identity and treatment sites confirmed yes.  Details of Procedure: Skin was cleaned with alcohol.  A 30 gauge, 1/2 inch needle was introduced to the target muscle.  Prior to injection, the needle plunger was aspirated to make sure the needle was not within a blood vessel.  There was no blood retrieved on aspiration.    Following is a summary of the muscles injected  And the amount of Botulinum toxin used:   Dilution 0.9% preservative free saline mixed with 100 u Botox type A to make 5 U per 0.1cc (2 cc of saline used per 100 U botox)  Injections  Location  Left   Right  Units   Upper lateral orbicularis oculi    2.5    2.5    5    Upper medial orbicularis oculi    2.5    2.5    5    Lateral orbicularis oculi    5    5    10     Lower lateral orbicularis oculi    2.5    2.5    5    Lower medial orbicularis oculi   2.5    2.5    5                      TOTAL UNITS:      30 Units    Agent: Botulinum Type A ( Onobotulinum Toxin type A ).   1 vials of Botox were used, each diluted with 2 cc of preservative free saline (50 units per 1 cc)              Total injected (Units): 30 units             Total wasted (Units): 70   Pt tolerated procedure well without complications.    Reinjection is anticipated in 3 months.

## 2013-05-01 ENCOUNTER — Emergency Department (HOSPITAL_COMMUNITY): Payer: Medicare Other

## 2013-05-01 ENCOUNTER — Encounter (HOSPITAL_COMMUNITY): Payer: Self-pay | Admitting: Emergency Medicine

## 2013-05-01 ENCOUNTER — Emergency Department (HOSPITAL_COMMUNITY)
Admission: EM | Admit: 2013-05-01 | Discharge: 2013-05-01 | Disposition: A | Discharge status: Home / Self Care | Payer: Medicare Other | Attending: Emergency Medicine | Admitting: Emergency Medicine

## 2013-05-01 DIAGNOSIS — Z860100 Personal history of colon polyps, unspecified: Secondary | ICD-10-CM | POA: Insufficient documentation

## 2013-05-01 DIAGNOSIS — S51811A Laceration without foreign body of right forearm, initial encounter: Secondary | ICD-10-CM

## 2013-05-01 DIAGNOSIS — J4489 Other specified chronic obstructive pulmonary disease: Secondary | ICD-10-CM | POA: Insufficient documentation

## 2013-05-01 DIAGNOSIS — S51809A Unspecified open wound of unspecified forearm, initial encounter: Secondary | ICD-10-CM | POA: Insufficient documentation

## 2013-05-01 DIAGNOSIS — M129 Arthropathy, unspecified: Secondary | ICD-10-CM | POA: Insufficient documentation

## 2013-05-01 DIAGNOSIS — G238 Other specified degenerative diseases of basal ganglia: Secondary | ICD-10-CM | POA: Insufficient documentation

## 2013-05-01 DIAGNOSIS — Y9389 Activity, other specified: Secondary | ICD-10-CM | POA: Insufficient documentation

## 2013-05-01 DIAGNOSIS — Z87891 Personal history of nicotine dependence: Secondary | ICD-10-CM | POA: Insufficient documentation

## 2013-05-01 DIAGNOSIS — Z7982 Long term (current) use of aspirin: Secondary | ICD-10-CM | POA: Insufficient documentation

## 2013-05-01 DIAGNOSIS — W19XXXA Unspecified fall, initial encounter: Secondary | ICD-10-CM

## 2013-05-01 DIAGNOSIS — J449 Chronic obstructive pulmonary disease, unspecified: Secondary | ICD-10-CM | POA: Insufficient documentation

## 2013-05-01 DIAGNOSIS — Z9981 Dependence on supplemental oxygen: Secondary | ICD-10-CM | POA: Insufficient documentation

## 2013-05-01 DIAGNOSIS — Z79899 Other long term (current) drug therapy: Secondary | ICD-10-CM | POA: Insufficient documentation

## 2013-05-01 DIAGNOSIS — Y9289 Other specified places as the place of occurrence of the external cause: Secondary | ICD-10-CM | POA: Insufficient documentation

## 2013-05-01 DIAGNOSIS — G608 Other hereditary and idiopathic neuropathies: Secondary | ICD-10-CM | POA: Insufficient documentation

## 2013-05-01 NOTE — ED Notes (Signed)
Bed: WA01 Expected date:  Expected time:  Means of arrival:  Comments: 

## 2013-05-01 NOTE — ED Provider Notes (Signed)
CSN: [id number]     Arrival date & time 05/01/13  1101 History   First MD Initiated Contact with Patient 05/01/13 1121     Chief Complaint  Patient presents with  . Fall  . skin tear     right forearm   (Consider location/radiation/quality/duration/timing/severity/associated sxs/prior Treatment) HPI Pt is a 77yo male hx of supranuclear palsy leading to hx of recurrent falls. Today, BIB EMS after falling while getting into his car.  Pt's legs became weak causing the fall.  During the fall, pt attained a skin tear to his right forearm. Denies hitting head, LOC, chest pain or abdominal pain. Denies head or neck pain.  Pt is not on blood thinners.  Daughter was in the room, reports pt is acting himself, is normally on oxygen at home, and falls often due to his supranuclear palsy for which he uses a walker at home.  Past Medical History  Diagnosis Date  . Dysphonia   . Supranuclear palsy 01/20/2011  . Colon polyp     Colonoscopy-[facility]   . Arthritis     Back  . Progressive supranuclear palsy   . Shortness of breath   . Neuromuscular disorder   . COPD (chronic obstructive pulmonary disease)     STAGE 2   Past Surgical History  Procedure Laterality Date  . Tonsillectomy    . Cataract extraction    . Colonoscopy with propofol N/A 10/12/2012    Procedure: COLONOSCOPY WITH PROPOFOL;  Surgeon: Beverley Fiedler, MD;  Location: [facility];  Service: Gastroenterology;  Laterality: N/A;   Family History  Problem Relation Age of Onset  . Diabetes    . Hypertension    . Stroke Other    History  Substance Use Topics  . Smoking status: Former Smoker -- 1.00 packs/day for 50 years    Quit date: 06/30/2006  . Smokeless tobacco: Never Used  . Alcohol Use: No     Comment: rare    Review of Systems  Cardiovascular: Negative for chest pain.  Gastrointestinal: Negative for abdominal pain.  Musculoskeletal: Positive for myalgias. Negative for back pain, joint swelling and neck pain.   Skin: Positive for wound.  Neurological: Positive for weakness ( legs (chronic)). Negative for headaches.  All other systems reviewed and are negative.    Allergies  Review of patient's allergies indicates no known allergies.  Home Medications   Current Outpatient Rx  Name  Route  Sig  Dispense  Refill  . aspirin 81 MG tablet   Oral   Take 81 mg by mouth daily.           . carbidopa-levodopa (SINEMET CR) 50-200 MG per tablet   Oral   Take 2 tablets by mouth 2 (two) times daily.          . carbidopa-levodopa (SINEMET IR) 25-100 MG per tablet   Oral   Take 4 tablets by mouth every morning.   120 tablet   5   . Homeopathic Products ([employer]S LEG CRAMPS PAIN RELIEF PO)   Oral   Take 1 capsule by mouth daily.         Marland Kitchen ketotifen (ZADITOR) 0.025 % ophthalmic solution   Both Eyes   Place 2 drops into both eyes 2 (two) times daily as needed (itchy eyes/allergies).         . naproxen sodium (ANAPROX) 220 MG tablet   Oral   Take 220 mg by mouth 2 (two) times daily with a meal.         .  QVAR 80 MCG/ACT inhaler      Use 1 puff 2 times daily. Rinse mouth after each use   8.7 g   PRN   . salmeterol (SEREVENT DISKUS) 50 MCG/DOSE diskus inhaler   Inhalation   Inhale 1 puff into the lungs daily.          Marland Kitchen SPIRIVA HANDIHALER 18 MCG inhalation capsule      Inhale one capsule by mouth one time daily   30 capsule   PRN    BP 119/57  Pulse 77  Temp(Src) 97.7 F (36.5 C) (Oral)  Resp 20  SpO2 88% Physical Exam  Nursing note and vitals reviewed. Constitutional: He appears well-developed and well-nourished.  Pt lying comfortably in exam bed, NAD.   HENT:  Head: Normocephalic and atraumatic.  Eyes: Conjunctivae are normal. No scleral icterus.  Neck: Normal range of motion.  Cardiovascular: Normal rate, regular rhythm and normal heart sounds.   Pulmonary/Chest: Effort normal and breath sounds normal. No respiratory distress. He has no wheezes. He has no rales.  He exhibits no tenderness.  Abdominal: Soft. Bowel sounds are normal. He exhibits no distension and no mass. There is no tenderness. There is no rebound and no guarding.  Musculoskeletal: Normal range of motion. He exhibits edema ( mild edema of right upper forearm) and tenderness ( right upper forearm ).  No bony tenderness of right elbow. FROM right elbow, wrist and hand.  Neurological: He is alert.  Skin: Skin is warm and dry.  Skin tear over right upper forearm    ED Course  Procedures (including critical care time) Labs Review Labs Reviewed - No data to display Imaging Review Dg Elbow Complete Right  05/01/2013   CLINICAL DATA:  Abrasion of the posterior elbow.  EXAM: RIGHT ELBOW - COMPLETE 3+ VIEW  COMPARISON:  None.  FINDINGS: There is no evidence of fracture, dislocation, or joint effusion. There is no evidence of arthropathy or other focal bone abnormality. Soft tissues are unremarkable.  IMPRESSION: Negative.   Electronically Signed   By: Rosalie Gums M.D.   On: 05/01/2013 12:38    EKG Interpretation   None       MDM   1. Fall, initial encounter   2. Skin tear of right forearm without complication    Hx of recurrent falls due to supranuclear palsy. Denies hitting head or LOC.   Pt does have skin tear of right forearm. Denies head, neck, chest, or stomach pain. Denies leg pain.  Pt able to whisper 'yes' and 'no'  Daughter in room reports pt acting normal per baseline. Pt not on blood thinners. Not concerned for emergent process taking place at this time. Will get plain film right elbow.  Clear and redress skin tear.  All labs/imaging/findings discussed with patient. All questions answered and concerns addressed. Will discharge pt home and have pt f/u with [facility] info provided. Return precautions given. Pt and daughter verbalized understanding and agreement with tx plan. Vitals: unremarkable. Discharged in stable condition.    Discussed pt with  attending during ED encounter and agrees with plan.     Junius Finner, PA-C 05/01/13 1354

## 2013-05-01 NOTE — ED Notes (Signed)
Per EMS pt was at [employer] getting in his car when he fell. Per EMS pt has skin tear to right forearm that was bandaged at scene by  Firemen.  Per pharmacists that were at scene said pt was mute. Per EMS when they got here to ED he whispered to them about not loosing his meds.

## 2013-05-02 NOTE — ED Provider Notes (Signed)
Medical screening examination/treatment/procedure(s) were performed by non-physician practitioner and as supervising physician I was immediately available for consultation/collaboration.  EKG Interpretation   None         Taeveon Keesling T Jefrey Raburn, MD 05/02/13 0848 

## 2013-06-20 ENCOUNTER — Ambulatory Visit (INDEPENDENT_AMBULATORY_CARE_PROVIDER_SITE_OTHER): Payer: Medicare Other | Admitting: Internal Medicine

## 2013-06-20 ENCOUNTER — Telehealth: Payer: Self-pay | Admitting: Internal Medicine

## 2013-06-20 ENCOUNTER — Encounter: Payer: Self-pay | Admitting: Internal Medicine

## 2013-06-20 VITALS — BP 90/60 | HR 109 | Ht 73.0 in | Wt 158.2 lb

## 2013-06-20 DIAGNOSIS — W19XXXD Unspecified fall, subsequent encounter: Secondary | ICD-10-CM

## 2013-06-20 DIAGNOSIS — I959 Hypotension, unspecified: Secondary | ICD-10-CM

## 2013-06-20 DIAGNOSIS — I499 Cardiac arrhythmia, unspecified: Secondary | ICD-10-CM

## 2013-06-20 DIAGNOSIS — J3800 Paralysis of vocal cords and larynx, unspecified: Secondary | ICD-10-CM

## 2013-06-20 DIAGNOSIS — W19XXXA Unspecified fall, initial encounter: Secondary | ICD-10-CM

## 2013-06-20 DIAGNOSIS — J449 Chronic obstructive pulmonary disease, unspecified: Secondary | ICD-10-CM

## 2013-06-20 NOTE — Progress Notes (Signed)
Patient ID: Spencer Montoya, male    DOB: 1936-05-30, 77 y.o.   MRN: [id number]  HPI 01/20/11-  15 yoM former smoker, here with daughter,  seen for pulmonary evaluation at request of daughter who was former patient here.  50 Pack year smoking hx, QS x 5 years. Dx'd COPD stage 2, about 4-5 years ago while living in [place] state. He moved to [place] 1 year ago, and moves here now to live with daughter. Widower, retired from [employer]..  Home O2 used for sleep x 2 years, but this was discontinued on move from [place], off x 1 week, needing to get re-established.  Describes little day to day change in breathing- probably worse in past year. . DOE around the house, but doesn't wake him. Does wheeze,  But controlled with Qvar 80, Serevent, Spiriva. Coughs only swallowing liquids. Swallowing eval reported negative- 09/05/10. Marland Kitchen  Has Supranuclear Palsy "form of Parkinson's", with no recent neurology evaluation. Aphonia  from this is almost total x 2 years- faintly whispers. Had Speech therapy [employer] Limited mobility - uses walker. Denies heart disease Pneumonia x 1, 3-4 years ago. Had pneumovax. Gets flu vax. PFT 03/27/10- FEV1 2.15/ 68%; FEV1/FVC 0.77, No response to BD; TLC 115%; RV/ TLC 132%;  CXR- 03/05/10- NAD  02/20/11- 15 year old former smoker followed for COPD, complicated by Supranuclear palsy/ vocal paresis After last visit, initial PCP visit is pending 02/28/11- he asks I refill carbidopa-levo ER 50-200, 1 TID, pending establishing a PCP. Then we expect PCP will refer to a Neurologist to establish.   . Home O2 started and helps, but he came today without it and resting room air sat 92% is better than 87% last time.  Now using mostly for sleep.  CXR 01/20/11- COPD, NAD.   06/26/11-  48 year old former smoker followed for COPD, complicated by Supranuclear palsy/ vocal paresis.  Daughter here Had flu vaccine. Daughter comments that his voice remains in audible except when he has a  cold, at which time voice gets huskier and he is much easier to understand. Since starting home oxygen he has been sleeping much better. They deny choking with food or drink. Chest x-ray 06/06/2011 was done when he fell and broke ribs.-Stable right posterior/ lateral rib fractures, emphysema.  12/25/11- 77 year old former smoker followed for COPD, complicated by Supranuclear palsy/ vocal paresis.   His voice has gotten no better after evaluation by Dr. Terri Skains and by the voice clinic at [facility] [place]. Easy dyspnea on exertion is no worse, noticed mainly crossing street briskly or climbing stairs. He denies cough or wheeze. Breathing is not waking. He admits choking a little some times swallowing, which has been my biggest concern with his voice problem, but he minimizes it and indicates he is careful.   COPD assessment test (CAT) score 16/40 CXR 11/22/11- reviewed IMPRESSION:  1. Emphysema.  2. Bilateral lower rib deformities compatible with old fractures.  3. Atherosclerosis.  Original Report Authenticated By: Dellia Cloud, M.D.    06/25/12- 70 year old former smoker followed for COPD, complicated by Supranuclear palsy/ vocal paresis.  Daughter here 6 month follow up.  c/o increased DOE over the past month.  O2 sat 86% on RA upon arrival to exam room.  Also, reports wheezing and prod cough.  No chest tightness, chest pain, or f/c/s.   Had fallen and hit chest on his walker leaving a bruise which is now resolving. Increased wheeze but not acutely ill. Oxygen 2 L for sleep/? Am Home Pt.  CXR 11/13 /13 IMPRESSION:  COPD without acute abnormality.  Original Report Authenticated By: Alcide Clever, M.D.   07/16/12- 59 year old former smoker followed for COPD, complicated by Supranuclear palsy/ vocal paresis.   ACUTE VISIT: wheezing, coughing, increased mucus. He remains unable to speak but brings a note from his daughter who has been treated for a sinus infection to which the patient has been  exposed. He indicates wheeze, cough and increased mucus but he denies sinus pain, pressure or postnasal drip. He continues oxygen at 2 L/ Advanced  12/24/12- 65 year old former smoker followed for COPD, complicated by Supranuclear palsy/ vocal paresis 6 month follow up.  Breathing unchanged.  Does have DOE.  No chest tightness, chest pain, or cough at this time.  Daughter here/living with him. Arrive with portable oxygen empty and switched to ours. We discussed checking his oxygen supply before leaving the house. They deny acute respiratory issues now. O2 2L/ Advanced.  06/20/13- 89 year old former smoker followed for COPD, complicated by Supranuclear palsy/ vocal paresis   Daughter here/living with him. ACUTE VISIT:  Pt with low SATS 82% and cough (non-productive)  in physical therapy today he was noted to have a low pulse, in the 30s, and low oxygen saturation about 70%. He says he felt the same as always, using oxygen at 2 L/Advanced. He had been told before that his heart rate was slow. Daughter has a history of irregular heartbeat. He has frequent falls related to his neurologic disorder. CXR 03/15/13 IMPRESSION:  No acute infiltrate or pulmonary edema. Stable mild hyperinflation.  Old fractures of the right 8th, 9th and 10th rib again noted.  Electronically Signed  By: Natasha Mead  On: 03/15/2013 17:14   Review of Systems-see HPI Constitutional:   No-   weight loss, night sweats, fevers, chills, fatigue, lassitude. HEENT:   No-   headaches, difficulty swallowing, tooth/dental problems, sore throat,                  No-   sneezing, itching, ear ache, nasal congestion, post nasal drip,  CV:  No-   chest pain, orthopnea, PND, swelling in lower extremities, anasarca, dizziness, palpitations GI:  No-   heartburn, indigestion, abdominal pain, nausea, vomiting, diarrhea,                 change in bowel habits, loss of appetite Resp:  No-  excess mucus, + dyspnea with exertion              No-   coughing up of blood.              No-   change in color of mucus.   Skin: No-   rash or lesions. GU:  MS:  No-   joint pain or swelling.  Psych:  No- change in mood or affect. No depression or anxiety.  No memory loss. Neuro: frequent falls, chronic aphonia Objective:   Physical Exam General- Alert, Oriented, Affect-appropriate, Distress- none acute;  Medium build; rolling walker. O2 2L                      + Aphonic- he makes no effort now to talk Skin- + scabs and scars on his forearms Lymphadenopathy- none Head- atraumatic            Eyes- Gross vision intact, PERRLA, conjunctivae clear secretions            Ears- Hearing, canals-normal  Nose- Clear, no-Septal dev, mucus, polyps, erosion, perforation             Throat- Mallampati II , mucosa clear , drainage- none, tonsils- atrophic Neck- flexible , trachea midline, no stridor , thyroid nl, carotid no bruit Chest - symmetrical excursion , unlabored           Heart/CV- +RRR/ frequent skipped beats , no murmur , no gallop  , no rub, nl s1 s2                           - JVD- none , edema- none, stasis changes- none, varices- none           Lung- clear, distant, unlabored, wheeze- none, cough- none , dullness-none, rub- none                          Chest wall-  Abd-  Br/ Gen/ Rectal- Not done, not indicated Extrem- cyanosis- none, clubbing, none, atrophy- none, strength- nl Neuro- uses a walker. Makes no effort to speak today.

## 2013-06-20 NOTE — Assessment & Plan Note (Addendum)
PFT 03/27/10- FEV1 2.15/68%; FEV1/FVC 0.77; FEF 25-75% 33%; no response to BD; TLC 115%; RV/TLC 132%; no DLCO reported CXR 03/05/10- NAD Pneumovax around 2009 O2 2 L/M sleep and as needed Reported slow heart rate and low oxygen saturation at physical therapy. Relation to exertion is unclear. He was wearing oxygen at 2 L. He says now that he is stable and baseline. Vital signs here are acceptable. If the readings obtained by physical therapy office today are valid, then wonder if he is having bradycardia-tachycardia syndrome. This might relate to his history of falls. His oxygen saturation would fall as perfusion fell. I would wonder about fitting him for a cardiac monitor. He may have autonomic instability. He might be a candidate for pacemaker.  Plan-referral to cardiology for evaluation of arrhythmia/hypotension with hypoxia, question cardiac monitor.

## 2013-06-20 NOTE — Patient Instructions (Signed)
Ok to keep O2 on 2L at rest. Would increase to 3-4 L/M with exertion.  Order- new referral to cardiology for dx irregular heart beat with hypotension. Consider monitoring.

## 2013-06-20 NOTE — Assessment & Plan Note (Signed)
Frequent falls despite rolling walker

## 2013-06-20 NOTE — Telephone Encounter (Signed)
Called, spoke with pt's daughter, [name]. Reports she received a call from PT today stating pt's o2 sat was in the 80s on 2 lpm upon his arrival.  They did not have him do much d/t low sats.  Lowest o2 sats went into the 70s on 2 lpm.  Per PT, pt was sleep when sats where down.  Also, reports PT advised pt's heart rate decreased to the 30s at one point.  It was up to 70s when he left.  They are now home.  Daughter reports pt does sound raspy, more congested, and has a nonprod cough.  Also reports pt was more clumsy than normal this morning.  Daughter reports pt states he "doesn't feel any different."  He has used nebs - helps at times.    I spoke with Dr. Maple Hudson:  They can either go to [facility] to be seen as daughter had mentioned this, can be worked in here today, or pred taper can be called in with OV within the next 1-2 wks.  Spoke with [name] again.  Advised of above recs per Dr. Maple Hudson.  She spoke with pt.  They would like to be worked in here today with CDY.  OV scheduled with CDY today at 1:30.  Daughter is aware to have pt here by 1:15 pm to be worked in and is to seek emergency care if needed prior to this.

## 2013-06-20 NOTE — Assessment & Plan Note (Signed)
I think he has given up trying to talk now. He has not been able to verbalize in a year

## 2013-06-28 ENCOUNTER — Ambulatory Visit: Payer: Medicare Other | Admitting: Pulmonary Disease

## 2013-06-28 ENCOUNTER — Ambulatory Visit: Payer: Medicare Other | Admitting: Internal Medicine

## 2013-07-01 ENCOUNTER — Ambulatory Visit: Payer: Medicare Other | Admitting: Neurology

## 2013-07-04 ENCOUNTER — Institutional Professional Consult (permissible substitution): Payer: Medicare Other | Admitting: Cardiology

## 2013-07-19 ENCOUNTER — Telehealth: Payer: Self-pay | Admitting: Internal Medicine

## 2013-07-19 NOTE — Telephone Encounter (Signed)
Patient past away @ [facility] Per Iver Nestle in [place]

## 2013-07-28 ENCOUNTER — Ambulatory Visit: Payer: Medicare Other | Admitting: Internal Medicine

## 2013-07-31 DEATH — deceased

## 2013-11-22 IMAGING — CR DG HAND COMPLETE 3+V*L*
3 series · 3 of 3 positions shown · non-contrast
Comparison: None.

CLINICAL DATA: Fall, laceration

EXAM:
LEFT HAND - COMPLETE 3+ VIEW

[x hand pa left]
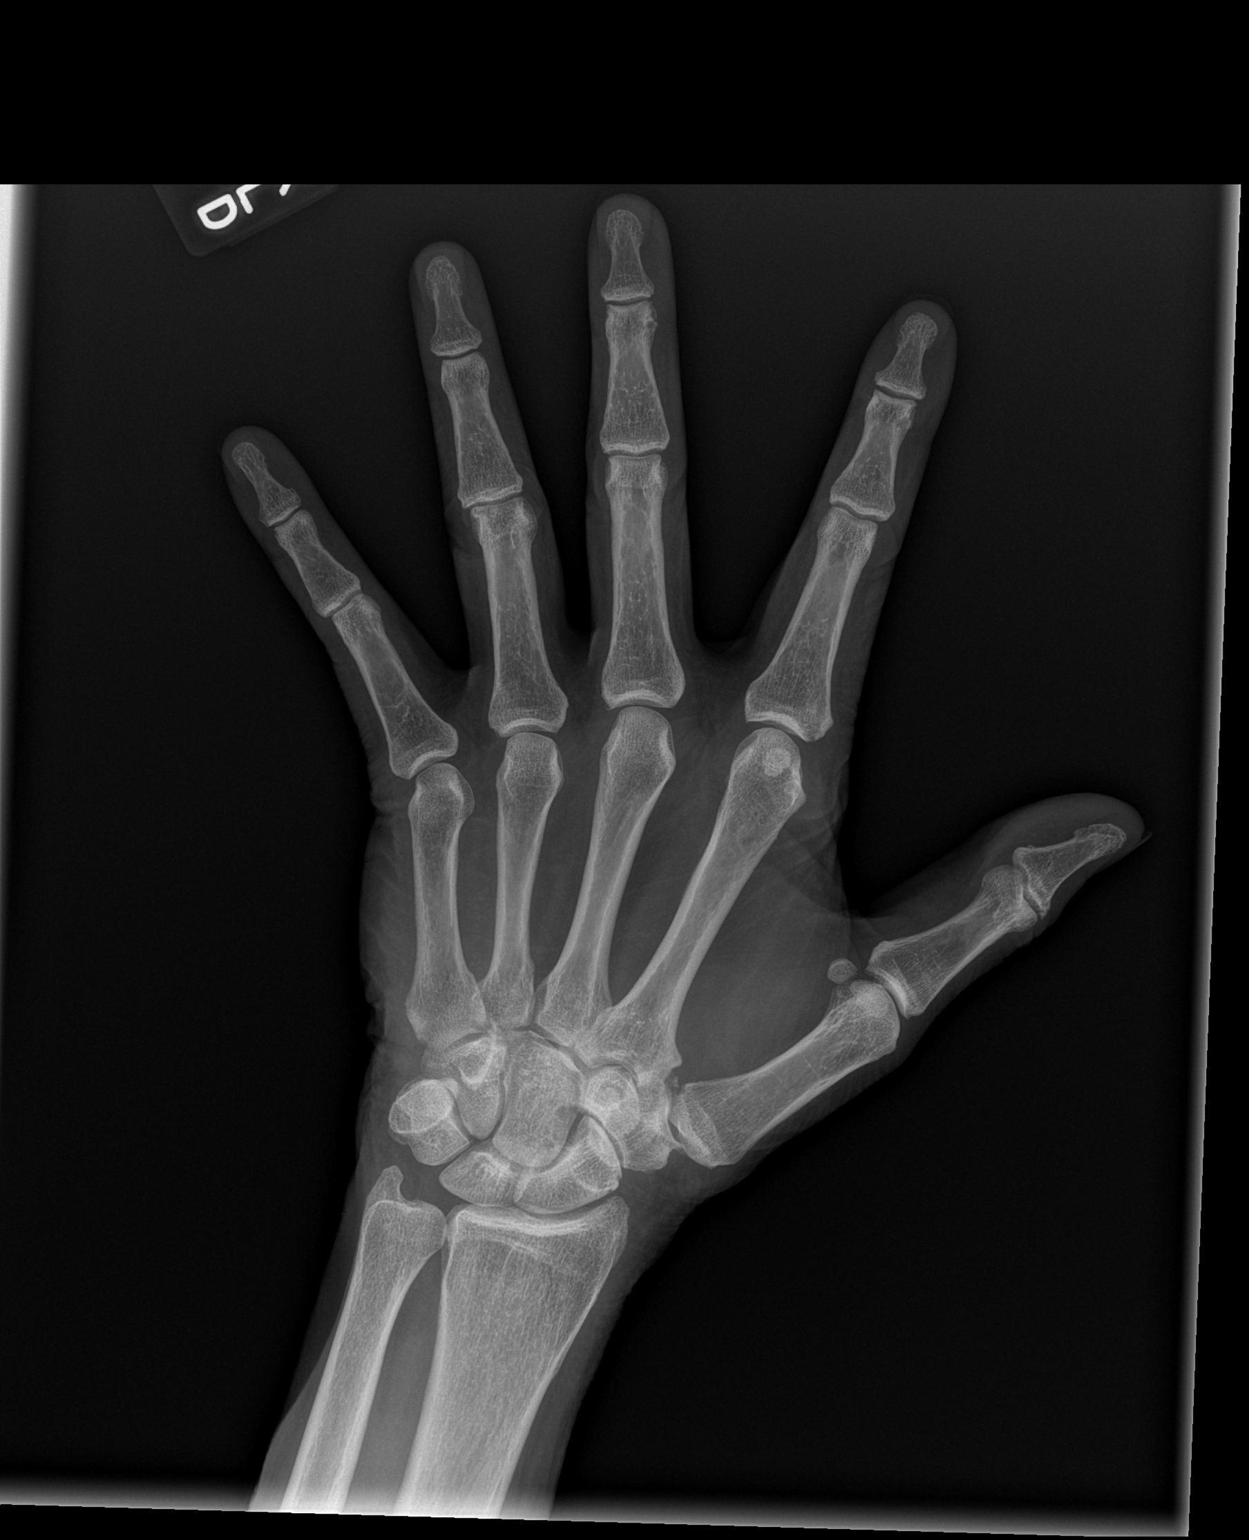

[x hand obl left]
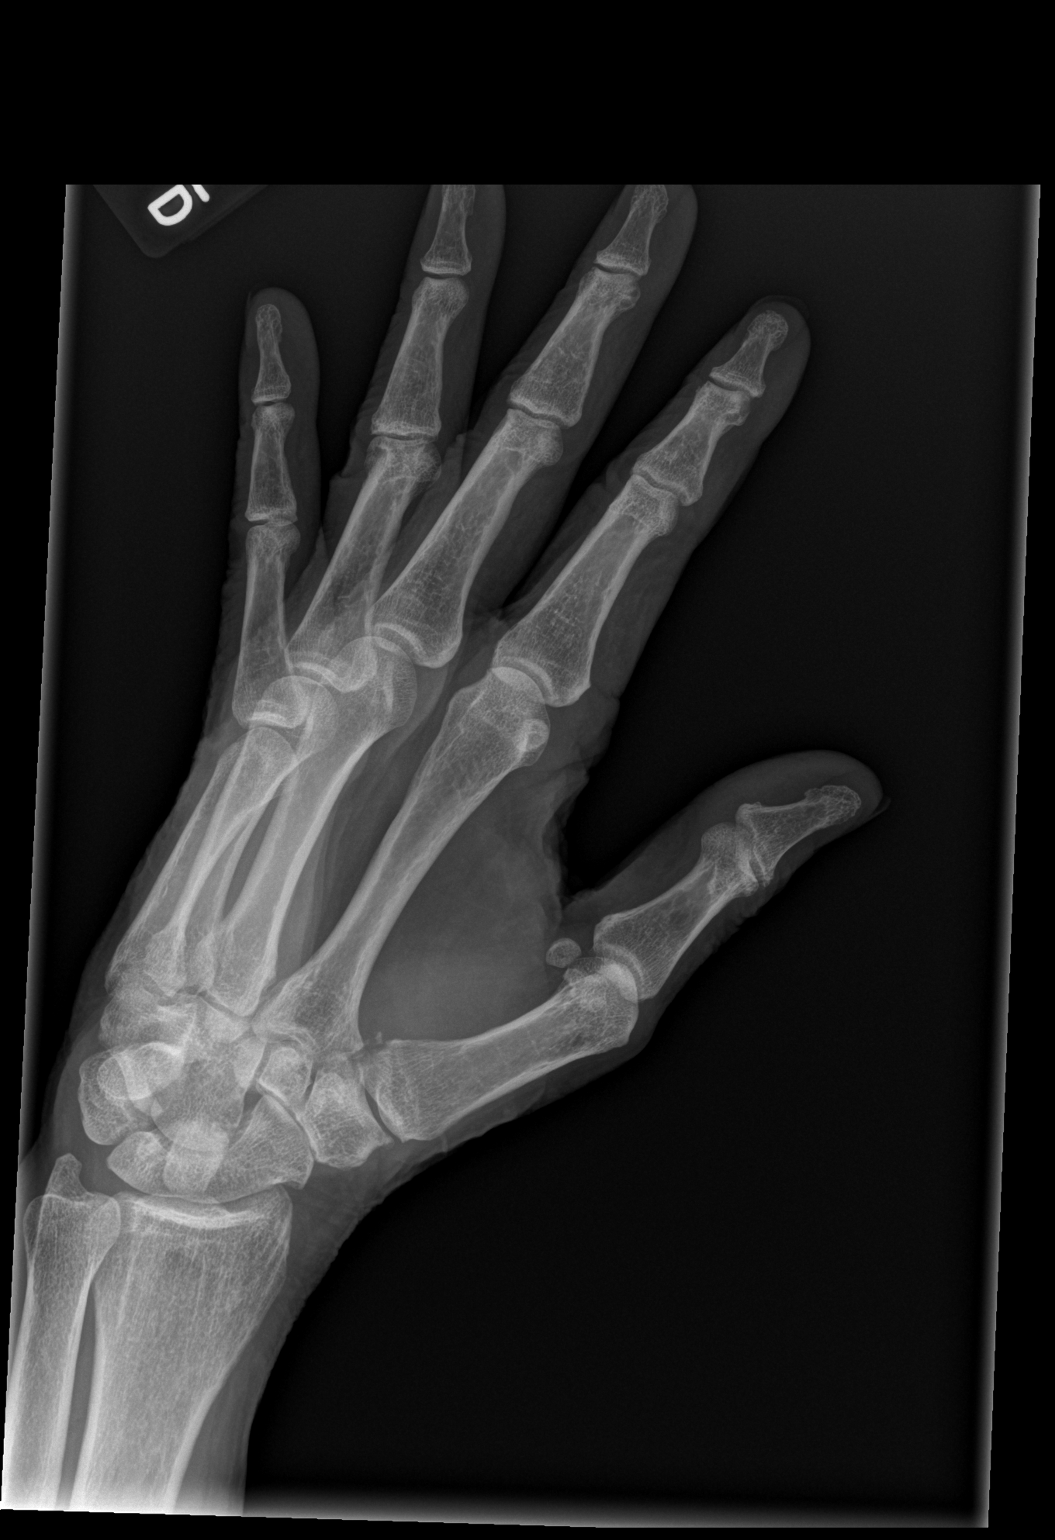

[x hand lat left]
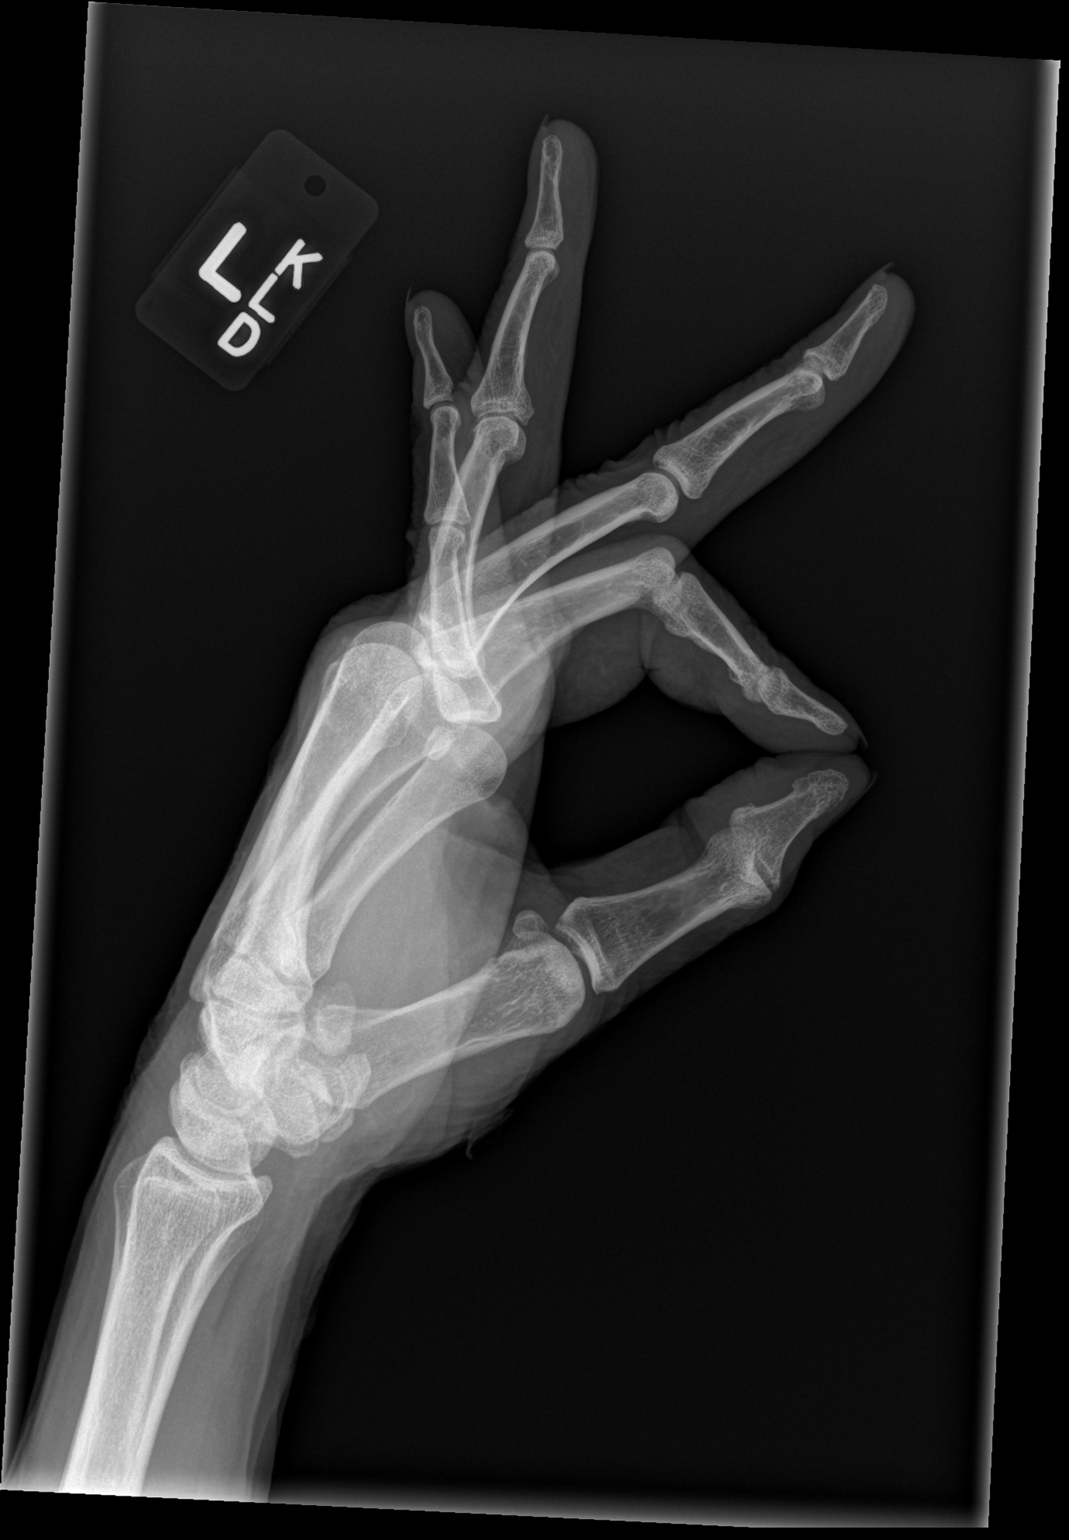

[3 of 3 positions shown; findings below may reference images not displayed]

FINDINGS: Three views of left hand submitted. No acute fracture or
subluxation. Mild degenerative changes proximal interphalangeal
joint 4th finger. Mild degenerative erosive changes distal medial
aspect of middle phalanx 3rd finger.
IMPRESSION: No acute fracture or subluxation. Mild degenerative changes.
